# Patient Record
Sex: Female | Born: 1965 | Race: White | Hispanic: No | Marital: Single | Smoking: Never smoker
Health system: Southern US, Community
[De-identification: ages and names within clinical notes are randomized; demographics above are authoritative.]

## PROBLEM LIST (undated history)

## (undated) DIAGNOSIS — K56609 Unspecified intestinal obstruction, unspecified as to partial versus complete obstruction: Secondary | ICD-10-CM

## (undated) DIAGNOSIS — K624 Stenosis of anus and rectum: Secondary | ICD-10-CM

## (undated) DIAGNOSIS — F419 Anxiety disorder, unspecified: Secondary | ICD-10-CM

## (undated) DIAGNOSIS — D4989 Neoplasm of unspecified behavior of other specified sites: Secondary | ICD-10-CM

## (undated) DIAGNOSIS — M797 Fibromyalgia: Secondary | ICD-10-CM

## (undated) DIAGNOSIS — E162 Hypoglycemia, unspecified: Secondary | ICD-10-CM

## (undated) DIAGNOSIS — K219 Gastro-esophageal reflux disease without esophagitis: Secondary | ICD-10-CM

## (undated) DIAGNOSIS — D649 Anemia, unspecified: Secondary | ICD-10-CM

## (undated) HISTORY — DX: Hypoglycemia, unspecified: E16.2

## (undated) HISTORY — DX: Anemia, unspecified: D64.9

## (undated) HISTORY — DX: Fibromyalgia: M79.7

## (undated) HISTORY — DX: Stenosis of anus and rectum: K62.4

## (undated) HISTORY — DX: Unspecified intestinal obstruction, unspecified as to partial versus complete obstruction: K56.609

## (undated) HISTORY — PX: KNEE SURGERY: SHX244

## (undated) HISTORY — DX: Gastro-esophageal reflux disease without esophagitis: K21.9

## (undated) HISTORY — DX: Anxiety disorder, unspecified: F41.9

---

## 1993-12-02 HISTORY — PX: TOTAL COLECTOMY: SHX852

## 1993-12-02 HISTORY — PX: PROCTECTOMY: SHX315

## 2001-10-27 ENCOUNTER — Other Ambulatory Visit: Admission: RE | Admit: 2001-10-27 | Discharge: 2001-10-27 | Payer: Self-pay | Admitting: *Deleted

## 2002-11-02 ENCOUNTER — Other Ambulatory Visit: Admission: RE | Admit: 2002-11-02 | Discharge: 2002-11-02 | Payer: Self-pay | Admitting: Obstetrics and Gynecology

## 2002-11-28 ENCOUNTER — Encounter: Payer: Self-pay | Admitting: Internal Medicine

## 2003-02-12 ENCOUNTER — Encounter: Payer: Self-pay | Admitting: Internal Medicine

## 2003-02-12 ENCOUNTER — Ambulatory Visit (HOSPITAL_COMMUNITY): Admission: RE | Admit: 2003-02-12 | Discharge: 2003-02-12 | Payer: Self-pay | Admitting: Internal Medicine

## 2003-06-09 ENCOUNTER — Ambulatory Visit (HOSPITAL_COMMUNITY): Admission: RE | Admit: 2003-06-09 | Discharge: 2003-06-09 | Payer: Self-pay | Admitting: Neurosurgery

## 2003-12-04 ENCOUNTER — Other Ambulatory Visit: Admission: RE | Admit: 2003-12-04 | Discharge: 2003-12-04 | Payer: Self-pay | Admitting: Obstetrics and Gynecology

## 2004-04-09 ENCOUNTER — Ambulatory Visit: Payer: Self-pay | Admitting: Internal Medicine

## 2004-04-11 ENCOUNTER — Ambulatory Visit: Payer: Self-pay | Admitting: Internal Medicine

## 2004-10-23 ENCOUNTER — Ambulatory Visit: Payer: Self-pay | Admitting: Internal Medicine

## 2004-10-23 ENCOUNTER — Encounter (INDEPENDENT_AMBULATORY_CARE_PROVIDER_SITE_OTHER): Payer: Self-pay | Admitting: Specialist

## 2004-10-23 ENCOUNTER — Ambulatory Visit (HOSPITAL_COMMUNITY): Admission: RE | Admit: 2004-10-23 | Discharge: 2004-10-23 | Payer: Self-pay | Admitting: Internal Medicine

## 2005-03-12 ENCOUNTER — Other Ambulatory Visit: Admission: RE | Admit: 2005-03-12 | Discharge: 2005-03-12 | Payer: Self-pay | Admitting: Obstetrics and Gynecology

## 2005-07-22 ENCOUNTER — Ambulatory Visit: Payer: Self-pay | Admitting: Cardiology

## 2005-08-12 ENCOUNTER — Ambulatory Visit: Payer: Self-pay

## 2005-08-12 ENCOUNTER — Encounter: Payer: Self-pay | Admitting: Cardiology

## 2005-12-19 ENCOUNTER — Emergency Department (HOSPITAL_COMMUNITY): Admission: EM | Admit: 2005-12-19 | Discharge: 2005-12-19 | Payer: Self-pay | Admitting: Emergency Medicine

## 2005-12-19 ENCOUNTER — Emergency Department (HOSPITAL_COMMUNITY): Admission: EM | Admit: 2005-12-19 | Discharge: 2005-12-20 | Payer: Self-pay | Admitting: *Deleted

## 2005-12-19 ENCOUNTER — Encounter (INDEPENDENT_AMBULATORY_CARE_PROVIDER_SITE_OTHER): Payer: Self-pay | Admitting: *Deleted

## 2007-03-25 ENCOUNTER — Emergency Department (HOSPITAL_COMMUNITY): Admission: EM | Admit: 2007-03-25 | Discharge: 2007-03-25 | Payer: Self-pay | Admitting: Emergency Medicine

## 2007-03-27 ENCOUNTER — Emergency Department (HOSPITAL_COMMUNITY): Admission: EM | Admit: 2007-03-27 | Discharge: 2007-03-27 | Payer: Self-pay | Admitting: Emergency Medicine

## 2007-03-29 ENCOUNTER — Ambulatory Visit: Payer: Self-pay | Admitting: Internal Medicine

## 2007-04-04 ENCOUNTER — Ambulatory Visit (HOSPITAL_COMMUNITY): Admission: RE | Admit: 2007-04-04 | Discharge: 2007-04-04 | Payer: Self-pay | Admitting: Internal Medicine

## 2007-07-19 ENCOUNTER — Ambulatory Visit: Payer: Self-pay | Admitting: Internal Medicine

## 2009-04-18 ENCOUNTER — Telehealth: Payer: Self-pay | Admitting: Internal Medicine

## 2009-05-06 ENCOUNTER — Ambulatory Visit: Payer: Self-pay | Admitting: Internal Medicine

## 2009-05-06 DIAGNOSIS — K219 Gastro-esophageal reflux disease without esophagitis: Secondary | ICD-10-CM

## 2009-05-06 DIAGNOSIS — R1310 Dysphagia, unspecified: Secondary | ICD-10-CM | POA: Insufficient documentation

## 2009-07-16 ENCOUNTER — Ambulatory Visit: Payer: Self-pay | Admitting: Internal Medicine

## 2009-07-17 ENCOUNTER — Ambulatory Visit: Payer: Self-pay | Admitting: Internal Medicine

## 2009-07-17 ENCOUNTER — Telehealth (INDEPENDENT_AMBULATORY_CARE_PROVIDER_SITE_OTHER): Payer: Self-pay | Admitting: *Deleted

## 2009-09-23 ENCOUNTER — Emergency Department (HOSPITAL_COMMUNITY): Admission: EM | Admit: 2009-09-23 | Discharge: 2009-09-23 | Payer: Self-pay | Admitting: Emergency Medicine

## 2010-04-01 NOTE — Progress Notes (Signed)
Summary: Sooner appt.  Phone Note Call from Patient Call back at Home Phone [phone]   Caller: Patient Call For: Dr. Marina Goodell Reason for Call: Talk to Nurse Summary of Call: Pt is having problems swallowing and has an appt. on 05-06-09 and wants to know if she can be worked in sooner Initial call taken by: Karna Christmas,  April 18, 2009 12:13 PM  Follow-up for Phone Call        Message left for pt. to call the scheduler's as she can be seen tomorrow by N.P. Follow-up by: Teryl Lucy RN,  April 18, 2009 12:25 PM

## 2010-04-01 NOTE — Miscellaneous (Signed)
Summary: Orders Update  Clinical Lists Changes  Orders: Added new Test order of TLB-H Pylori Screen Gastric Biopsy (83013-CLOTEST) - Signed 

## 2010-04-01 NOTE — Procedures (Signed)
Summary: Flex Sigmoidoscopy   Flexible Sigmoidoscopy  Procedure date:  10/23/2004  Findings:      Anal Stricture Ulceration Pouch  Comments:      Location: [facility].   Flexible Sigmoidoscopy  Procedure date:  10/23/2004  Findings:      Anal Stricture Ulceration Pouch  Comments:      Location: [facility].  Patient Name: Gwendolyn Howard, Gwendolyn Howard MRN: [id number] Procedure Procedures: Flexible Proctosigmoidoscopy CPT: [phone].    with dilation of stricture.CPT: Q632156.  Personnel: Endoscopist: Wilhemina Bonito. Marina Goodell, MD.  Exam Location: Exam performed in Endoscopy Suite.  Patient Consent: Procedure, Alternatives, Risks and Benefits discussed, consent obtained,  Indications  Therapeutic Intervention Reason for exam: Dilation of Stricture.  Symptoms: Abdominal pain / bloating.  History  Current Medications: Patient is not currently taking Coumadin.  Pre-Exam Physical: Performed Oct 23, 2004. Entire physical exam was normal. Rectal exam WNL. Abnormal PE findings include: patent anastomosis on rectal.  Exam Exam: Extent visualized: Terminal Ileum. Extent of exam: 30 cm. Patient position: on left side. Colon retroflexion not performed. Images taken. ASA Classification: II. Tolerance: excellent.  Monitoring: Pulse and BP monitoring, Oximetry used. Supplemental O2 given.  Colon Prep Used ducolax for colon prep. Prep: excellent.  Fluoroscopy: Fluoroscopy was not used.  Sedation Meds: No sedation was given.  Findings - PRIOR SURGERY: Comments: S/P TOTAL ABDOMINAL COLECTOMY W/ ILEOANAL PULL THROUGH.  - OTHER FINDING:  MILD FOCAL SUPERFICIAL ULCERATION IN POUCH found in Rectum. Biopsy/Other Finding taken.  STRICTURE / STENOSIS: Rectum.  Constriction: partial. Etiology: ANASTOMOTIC. Lumen diameter is 13 mm. ICD9: Anal Stricture: 569.2.  - Dilation: Rectum. Balloon/Wilson-Cook dilator used, Diameter: 16 mm, Minimal Resistance, Minimal Heme present on extraction.  1  total dilators used. Patient tolerance excellent.   Assessment Abnormal examination, see findings above.  Diagnoses: 569.2: Anal Stricture.   Comments: ULCERATION / POUCH Events  Unplanned Intervention: No intervention was required.  Unplanned Events: There were no complications. Plans Disposition: After procedure patient sent to recovery. After recovery patient sent home.  Scheduling: Follow-Up prn. Call office for biopsy results, to Wilhemina Bonito. Marina Goodell, MD, NEXT WEEK,    This report was created from the original endoscopy report, which was reviewed and signed by the above listed endoscopist.   cc:  The Patient

## 2010-04-01 NOTE — Procedures (Signed)
Summary: Upper Endoscopy  Patient: [name] Note: All result statuses are Final unless otherwise noted.  Tests: (1) Upper Endoscopy (EGD)   EGD Upper Endoscopy       DONE     [facility]     [address]     [place], [place]  [address]           ENDOSCOPY PROCEDURE REPORT           PATIENT:  Gwendolyn Howard, Gwendolyn Howard  MR#:  [id number]     BIRTHDATE:  02/08/1966, 43 yrs. old  GENDER:  female           ENDOSCOPIST:  Wilhemina Bonito. Eda Keys, MD     Referred by:  Office           PROCEDURE DATE:  07/16/2009     PROCEDURE:  EGD with biopsy     ASA CLASS:  Class II     INDICATIONS:  GERD, dysphagia           MEDICATIONS:   Fentanyl 100 mcg IV, Versed 10 mg IV     TOPICAL ANESTHETIC:  Exactacain Spray           DESCRIPTION OF PROCEDURE:   After the risks benefits and     alternatives of the procedure were thoroughly explained, informed     consent was obtained.  The LB [id number]-H180 [id number] endoscope was     introduced through the mouth and advanced to the second portion of     the duodenum, without limitations.  The instrument was slowly     withdrawn as the mucosa was fully examined.     <<PROCEDUREIMAGES>>           The upper, middle, and distal third of the esophagus were     carefully inspected and no abnormalities were noted. The z-line     was well seen at the GEJ. The endoscope was pushed into the fundus     which was normal including a retroflexed view. The antrum,gastric     body, and second part of the duodenum were unremarkable.  Mild     nonerosiveDuodenitis was found in the bulb of the duodenum. CLO BX     taken.   Retroflexed views revealed no abnormalities.    The scope     was then withdrawn from the patient and the procedure completed.           COMPLICATIONS:  None           ENDOSCOPIC IMPRESSION:     1) Essentially Normal EGD     2) Mild Duodenitis in the bulb of duodenum     3) Gerd           RECOMMENDATIONS:     1) Continue PPI daily (on Zegerid)     2)  Reflux precautions     3) Treat H.Pylori if CLO test +     4) Follow up prn           ______________________________     Wilhemina Bonito. Eda Keys, MD           CC:  Belva Agee, NP, The Patient           n.     Rosalie DoctorWilhemina Bonito. Eda Keys at 07/16/2009 02:56 PM           Verneita Griffes, [id number]  Note: An exclamation mark (!) indicates a result that was not dispersed into  the flowsheet. Document Creation Date: 07/18/2009 5:07 PM _______________________________________________________________________  (1) Order result status: Final Collection or observation date-time: 07/16/2009 14:48 Requested date-time:  Receipt date-time:  Reported date-time:  Referring Physician:   Ordering Physician: Fransico Setters [phone]) Specimen Source:  Source: [name] Order Number: [id number] Lab site:

## 2010-04-01 NOTE — Progress Notes (Signed)
Summary: [facility]  [facility]   Imported By: Lanelle Bal 06/19/2009 09:54:49  _____________________________________________________________________  External Attachment:    Type:   Image     Comment:   External Document

## 2010-04-01 NOTE — Procedures (Signed)
Summary: Flex Sigmoidoscopy   Flexible Sigmoidoscopy  Procedure date:  02/12/2003  Findings:      Anal Stricture  Comments:      Location: [facility].  Patient Name: Cornesha, Radziewicz MRN: [id number] Procedure Procedures: Flexible Proctosigmoidoscopy CPT: [phone].    with dilation of stricture.CPT: Q632156.  Personnel: Endoscopist: Wilhemina Bonito. Marina Goodell, MD.  Exam Location: Exam performed in Endoscopy Suite.  Patient Consent: Procedure, Alternatives, Risks and Benefits discussed, consent obtained,  Indications  Therapeutic Intervention Reason for exam: Dilation of Stricture.  History  Current Medications: Patient is not currently taking Coumadin.  Comments: S/P TOTAL ABDOMINAL COLECTOMY AND PROCTECTOMY WITH ILEAL POUCH AND ILEOANAL PULLTHROUGH Pre-Exam Physical: Performed Feb 12, 2003. Entire physical exam was normal. Rectal exam abnormal. Abnormal PE findings include: palpable stricture.  Exam Exam: Extent visualized: Ileum. Extent of exam: 25 cm. Patient position: on left side. Images taken. ASA Classification: II. Tolerance: excellent.  Monitoring: Pulse and BP monitoring, Oximetry used. Supplemental O2 given.  Colon Prep Used oral laxative for colon prep. Prep: excellent.  Fluoroscopy: Fluoroscopy was not used.  Sedation Meds: No sedation was given.  Findings STRICTURE / STENOSIS: Stricture in Rectum.  Constriction: partial. Etiology: ANASTOMOTIC. Lumen diameter is 9 mm. Comments: HEALTHY POUCH AND DISTAL ILEAL MUCOSA.  - Dilation: Rectum. Balloon/Microvasive dilator used, Diameter: 16 mm, Minimal Resistance, Minimal Heme present on extraction. 1  total dilators used. Patient tolerance excellent. Outcome: successful.   Assessment Abnormal examination, see findings above.  Events  Unplanned Intervention: No intervention was required.  Unplanned Events: There were no complications. Plans Disposition: After procedure patient sent to recovery.    Scheduling: Follow-Up prn.   This report was created from the original endoscopy report, which was reviewed and signed by the above listed endoscopist.   cc:  The Patient

## 2010-04-01 NOTE — Progress Notes (Signed)
Summary: [facility]  [facility]   Imported By: Lanelle Bal 06/19/2009 09:56:48  _____________________________________________________________________  External Attachment:    Type:   Image     Comment:   External Document

## 2010-04-01 NOTE — Assessment & Plan Note (Signed)
Summary: problems swallowing--ch.   History of Present Illness Visit Type: Follow-up Visit Primary GI MD: Yancey Flemings MD Primary Provider: Belva Agee, NP Chief Complaint: Patient having some dysphagia since she stopped taking her Nexium daily as directed. Her symptoms started a couple of weeks ago. Since restarting her Nexium daily her dysphagia has been better. She complains of some chest pain that has since resolved.  History of Present Illness:   45 year old female with a history fibromyalgia, anxiety disorder, asthma, of ulcerative colitis for which she eventually underwent total abdominal colectomy and proctectomy with ileal anal pouch and diverting ileostomy with subsequent closure of her loop ileostomy in 1995. She has had intermittent problems with low anastomotic stricturing which has required self dilation as well as endoscopic balloon dilation. She was last seen in the office in January of 2009. She is known to have reflux disease. She was to have had upper endoscopy but did not follow through. Her chief complaint today is worsening reflux and dysphagia. Reports that she had been taking Nexium sporadically with significant reflux. Several weeks ago she describes intermittent solid and pill dysphagia. Since making her office appointment in a few weeks ago, she has taken the Nexium regularly. She has had improvement in classic reflux symptoms. Dysphasia has also improved somewhat though not entirely. She has not had prior upper endoscopy. Currently without notable lower GI complaints.   GI Review of Systems    Reports acid reflux, chest pain, dysphagia with liquids, dysphagia with solids, and  heartburn.      Denies abdominal pain, belching, bloating, loss of appetite, nausea, vomiting, vomiting blood, weight loss, and  weight gain.        Denies anal fissure, black tarry stools, change in bowel habit, constipation, diarrhea, diverticulosis, fecal incontinence, heme positive stool,  hemorrhoids, irritable bowel syndrome, jaundice, light color stool, liver problems, rectal bleeding, and  rectal pain. Preventive Screening-Counseling & Management  Alcohol-Tobacco     Smoking Status: never      Drug Use:  no.      Current Medications (verified): 1)  Flexeril 10 Mg Tabs (Cyclobenzaprine Hcl) .... Take 1 By Mouth As Needed 2)  Nexium 40 Mg Cpdr (Esomeprazole Magnesium) .Marland Kitchen.. 1 Capsule By Mouth Once Daily 3)  Advair Diskus 250-50 Mcg/dose Aepb (Fluticasone-Salmeterol) .... Use Two Times A Day 4)  Ibuprofen 200 Mg Tabs (Ibuprofen) .... Take As Needed 5)  Benicar 20 Mg Tabs (Olmesartan Medoxomil) .... Take One By Mouth Once Daily 6)  Aleve 220 Mg Tabs (Naproxen Sodium) .... Take As Needed 7)  Cyanocobalamin 1000 Mcg/ml Soln (Cyanocobalamin) .... One Ml Injection Once A Month  Allergies (verified): 1)  ! Erythromycin 2)  ! Asacol  Past History:  Past Medical History: Anal Stricture GERD Ulcerative Colitis Small Bowel Obstruction Fibromyalgia Anxiety Disorder Hypoglycemia Anemia  Past Surgical History: Total Colectomy/Proctectomy with ileal anal pouch Lt. 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  Alcohol Use - yes once every 3-4 months Daily Caffeine Use 2 soda per day Illicit Drug Use - no Smoking Status:  never Drug Use:  no  Review of Systems       The patient complains of allergy/sinus, anemia, arthritis/joint pain, back pain, fatigue, menstrual pain, muscle pains/cramps, and swelling of feet/legs.  The patient denies anxiety-new, blood in urine, breast changes/lumps, change in vision, confusion, cough, coughing up blood, depression-new, fainting, fever,  headaches-new, hearing problems, heart murmur, heart rhythm changes, itching, night sweats, nosebleeds, pregnancy symptoms, shortness of breath, skin rash, sleeping problems, sore throat, swollen lymph glands, thirst - excessive , urination - excessive , urination changes/pain, urine leakage, vision changes, and voice change.    Vital Signs:  Patient profile:   45 year old female Height:      65.5 inches Weight:      219.0 pounds BMI:     36.02 Pulse rate:   80 / minute Pulse rhythm:   regular BP sitting:   118 / 68  (left arm) Cuff size:   regular  Vitals Entered By: Harlow Mares CMA Duncan Dull) (May 06, 2009 9:02 AM)  Physical Exam  General:  Well developed, well nourished, no acute distress. Head:  Normocephalic and atraumatic. Eyes:  conjunctivitis right eye Ears:  Normal auditory acuity. Nose:  No deformity, discharge,  or lesions. Mouth:  No deformity or lesions, dentition normal. Neck:  Supple; no masses or thyromegaly. Lungs:  Clear throughout to auscultation. Heart:  Regular rate and rhythm; no murmurs, rubs,  or bruits. Abdomen:  Soft, nontender and nondistended. No masses, hepatosplenomegaly or hernias noted. Normal bowel sounds.surgical incisions well-healed Msk:  Symmetrical with no gross deformities. Normal posture. Pulses:  Normal pulses noted. Extremities:  No clubbing, cyanosis, edema or deformities noted. Neurologic:  Alert and  oriented x4;  grossly normal neurologically. Skin:  Intact without significant lesions or rashes. Cervical Nodes:  No significant cervical adenopathy.no supraclavicular adenopathy Psych:  Alert and cooperative. Normal mood and affect.   Impression & Recommendations:  Problem # 1:  ESOPHAGEAL REFLUX (ICD-530.81) chronic GERD. Significant breakthrough symptoms off Nexium.  Plan: #1. Resume Nexium 40 mg daily. She does take the medication 30 minutes before the first meal #2. Reflux precautions  Problem # 2:  DYSPHAGIA UNSPECIFIED  (ICD-787.20) intermittent solid food and pill dysphagia. Improved, though incompletely, with Nexium. Rule out peptic stricture.  Plan: #1. Upper endoscopy with possible esophageal dilation. The nature of the procedure as well as the risks, benefits, and alternatives have been reviewed. She understood and agreed to proceed.  Problem # 3:  Prior history of ulcerative colitis status post total abdominal colectomy and proctectomy with ileoanal anastomosis. History of anastomotic stricture. Currently stable without symptoms  Other Orders: EGD (EGD)  Patient Instructions: 1)  Upper Endoscopy brochure given.  2)  You have been scheduled for a EGD on 06/17/09. 3)  The medication list was reviewed and reconciled.  All changed / newly prescribed medications were explained.  A complete medication list was provided to the patient / caregiver.

## 2010-04-01 NOTE — Progress Notes (Signed)
Summary: [facility]  [facility]   Imported By: Lanelle Bal 06/19/2009 09:55:44  _____________________________________________________________________  External Attachment:    Type:   Image     Comment:   External Document

## 2010-04-01 NOTE — Letter (Signed)
Summary: EGD Instructions  [facility]  [address] [place], [place] [address]   Phone: [phone]  Fax: [phone]       Gwendolyn Howard    Nov 16, 1965    MRN: [id number]       Procedure Day /Date:06/17/09     Arrival Time: 12:30 pm     Procedure Time:1:30pm    Location of Procedure:                    X [facility] (4th Floor)   PREPARATION FOR ENDOSCOPY   On 06/17/09 THE DAY OF THE PROCEDURE:  1.   No solid foods, milk or milk products are allowed after midnight the night before your procedure.  2.   Do not drink anything colored red or purple.  Avoid juices with pulp.  No orange juice.  3.  You may drink clear liquids until 930 am , which is 2 hours before your procedure.                                                                                                CLEAR LIQUIDS INCLUDE: Water Jello Ice Popsicles Tea (sugar ok, no milk/cream) Powdered fruit flavored drinks Coffee (sugar ok, no milk/cream) Gatorade Juice: apple, white grape, white cranberry  Lemonade Clear bullion, consomm, broth Carbonated beverages (any kind) Strained chicken noodle soup Hard Candy   MEDICATION INSTRUCTIONS  Unless otherwise instructed, you should take regular prescription medications with a small sip of water as early as possible the morning of your procedure.            OTHER INSTRUCTIONS  You will need a responsible adult at least 46 years of age to accompany you and drive you home.   This person must remain in the waiting room during your procedure.  Wear loose fitting clothing that is easily removed.  Leave jewelry and other valuables at home.  However, you may wish to bring a book to read or an iPod/MP3 player to listen to music as you wait for your procedure to start.  Remove all body piercing jewelry and leave at home.  Total time from sign-in until discharge is approximately 2-3 hours.  You should go home directly after your  procedure and rest.  You can resume normal activities the day after your procedure.  The day of your procedure you should not:   Drive   Make legal decisions   Operate machinery   Drink alcohol   Return to work  You will receive specific instructions about eating, activities and medications before you leave.    The above instructions have been reviewed and explained to me by   _______________________    I fully understand and can verbalize these instructions _____________________________ Date _________

## 2010-04-01 NOTE — Progress Notes (Signed)
  Phone Note Call from Patient   Summary of Call: Pt. called and requested written rx. for Zegerid  so she can use her "[employer]" as she discussed with Dr.Perry at procedure yesterday that it works better than Nexium. RX. mailed to pt. Initial call taken by: Teryl Lucy RN,  Jul 17, 2009 4:04 PM    New/Updated Medications: ZEGERID OTC 20-1100 MG CAPS (OMEPRAZOLE-SODIUM BICARBONATE) Take 1 p.o.q.am one half hr.prior to breakfast Prescriptions: ZEGERID OTC 20-1100 MG CAPS (OMEPRAZOLE-SODIUM BICARBONATE) Take 1 p.o.q.am one half hr.prior to breakfast  #30 x 11   Entered by:   Teryl Lucy RN   Authorized by:   Hilarie Fredrickson MD   Signed by:   Teryl Lucy RN on 07/17/2009   Method used:   Print then Give to Patient   RxID:   [id number]

## 2010-07-15 NOTE — Assessment & Plan Note (Signed)
[facility]                         GASTROENTEROLOGY OFFICE NOTE   NAME:Antonio, Gwendolyn Howard                      MRN:          [id number]  DATE:03/29/2007                            DOB:          1965-12-20    HISTORY:  Patient presents today for followup.  She has not been seen in  this office for three years.  She is 45 years old and has a history of  universal ulcerative colitis for which she eventually underwent total  abdominal colectomy and proctectomy with ileoanal pouch and diverting  ileostomy with subsequent closure of her loop ileostomy in 1995.  She  has also had intermittent problems with low anastomotic stricturing for  which she has self dilated as well as required endoscopic balloon  dilatation.  She states that she has had some intermittent problems with  nausea over the past eight to nine months.  No vomiting.  She also has  indigestion and heartburn despite once daily Nexium.  No dysphagia.  This past weekend she developed nausea, vomiting, diarrhea and  dehydration,  She was seen in the emergency room on a couple of  occasions and required some IV fluids.  She was felt to have a viral  gastroenteritis.  Her labs were unremarkable.  Imodium helped her  diarrhea and Zofran helped her nausea.  Vomiting has subsided.  She is  concerned today regarding her nausea.  Also she feels she may have  problems with her gall bladder because there is a family history.  She  had a negative abdominal ultrasound three years ago.  She states she has  had some perianal pain and wonders if she may not need her pouch  evaluated.  Her last flexible sigmoidoscopy was in August of 2006.  She  was found to have distal stricture as well as a mild focal superficial  ulceration of the pouch.   PAST MEDICAL HISTORY:  As above.   ALLERGIES:  Erythromycin and Acecol.   CURRENT MEDICATIONS:  Tandem, Flexeril, Nexium, B12 injections and  Cymbalta.   FAMILY  HISTORY:  Noncontributory.   SOCIAL HISTORY:  Single without children.  Works as a Engineer, civil (consulting) at [employer].   PHYSICAL EXAMINATION:  Well appearing female in no acute distress.  Blood pressure is 120/86.  Heart rate is 80.  Weight is 208.2 pounds.  HEENT:  Sclerae are anicteric.  LUNGS:  Clear.  HEART: Regular.  ABDOMEN:  Soft without tenderness, mass or hernia.  Previous surgical  incisions are well healed.  RECTAL:  Exam was deferred.   IMPRESSION:  1. Recent problems with nausea, vomiting and diarrhea almost certainly      due to viral gastroenteritis.  The patient has improved.  2. Gastroesophageal reflux disease.  Breakthrough symptoms on once      daily Nexium.  3. Intermittent problems with nausea likely due to breakthrough      reflux.  4. History of ulcerative colitis status post total abdominal colectomy      with ileoanal anastomosis.  Postoperative problems with stricturing      requiring dilation, now with some vague  rectal discomfort.   RECOMMENDATIONS:  1. Continue Nexium.  2. Reflux precautions with attention to weight loss.  3. Upper endoscopy to evaluate persistent nausea and chronic reflux      symptoms.  4. Repeat abdominal ultrasound to rule out gallstones as a cause for      chronic nausea and occassional pain.  5. Flexible sigmoidoscopy at the time of endoscopy to assess her pouch      and stricture.  She may require dilatation if significant      stricturing found.  The nature of the procedure as well as the      risks, benefits and alternatives have been reviewed.  She      understood and agreed to proceed.  6. Continue to use Imodium and Zofran p.r.n.     Wilhemina Bonito. Marina Goodell, MD  Electronically Signed    JNP/MedQ  DD: 03/29/2007  DT: 03/29/2007  Job #: [id number]   cc:   Magnus Sinning. Rice, M.D.

## 2010-11-02 ENCOUNTER — Emergency Department (HOSPITAL_COMMUNITY): Payer: PRIVATE HEALTH INSURANCE

## 2010-11-02 ENCOUNTER — Emergency Department (HOSPITAL_COMMUNITY)
Admission: EM | Admit: 2010-11-02 | Discharge: 2010-11-02 | Disposition: A | Discharge status: Home / Self Care | Payer: PRIVATE HEALTH INSURANCE | Attending: Emergency Medicine | Admitting: Emergency Medicine

## 2010-11-02 DIAGNOSIS — M25519 Pain in unspecified shoulder: Secondary | ICD-10-CM | POA: Insufficient documentation

## 2010-11-20 LAB — CLOSTRIDIUM DIFFICILE EIA

## 2010-11-20 LAB — URINALYSIS, ROUTINE W REFLEX MICROSCOPIC
Glucose, UA: NEGATIVE
Hgb urine dipstick: NEGATIVE
Leukocytes, UA: NEGATIVE
Protein, ur: NEGATIVE
Specific Gravity, Urine: >1.03 — ABNORMAL HIGH
pH: 5.5

## 2010-11-20 LAB — CBC
MCHC: 33.8
MCV: 90.3
Platelets: 389
WBC: 8.4

## 2010-11-20 LAB — OVA AND PARASITE EXAMINATION: Ova and parasites: NONE SEEN

## 2010-11-20 LAB — POCT I-STAT CREATININE
Creatinine, Ser: 0.8
Operator id: 284251

## 2010-11-20 LAB — DIFFERENTIAL
Basophils Absolute: 0
Basophils Relative: 0
Eosinophils Absolute: 0.2
Neutrophils Relative %: 69

## 2010-11-20 LAB — I-STAT 8, (EC8 V) (CONVERTED LAB)
Acid-base deficit: 2
Bicarbonate: 24.6 — ABNORMAL HIGH
TCO2: 26
pCO2, Ven: 47.1
pH, Ven: 7.326 — ABNORMAL HIGH

## 2010-11-20 LAB — STOOL CULTURE

## 2010-11-20 LAB — BASIC METABOLIC PANEL
CO2: 25
Calcium: 8.9
Creatinine, Ser: 0.85
GFR calc Af Amer: >60
GFR calc non Af Amer: >60

## 2010-11-20 LAB — URINE MICROSCOPIC-ADD ON

## 2011-04-13 ENCOUNTER — Ambulatory Visit (INDEPENDENT_AMBULATORY_CARE_PROVIDER_SITE_OTHER): Payer: PRIVATE HEALTH INSURANCE | Admitting: Internal Medicine

## 2011-04-13 ENCOUNTER — Encounter: Payer: Self-pay | Admitting: Internal Medicine

## 2011-04-13 VITALS — BP 104/70 | HR 64 | Ht 65.5 in | Wt 206.6 lb

## 2011-04-13 DIAGNOSIS — K219 Gastro-esophageal reflux disease without esophagitis: Secondary | ICD-10-CM

## 2011-04-13 MED ORDER — DEXLANSOPRAZOLE 60 MG PO CPDR: 60.0000 mg | DELAYED_RELEASE_CAPSULE | Freq: Every day | ORAL | Status: DC

## 2011-04-13 NOTE — Patient Instructions (Signed)
We have sent the following medications to your pharmacy for you to pick up at your convenience:  Dexilant  You have also been given some samples of dexilant and some reflux information

## 2011-04-13 NOTE — Progress Notes (Signed)
HISTORY OF PRESENT ILLNESS:  Gwendolyn Howard is a 46 y.o. 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 She has intermittent problems with low anastomotic stricturing for which she self dilates. She has also had balloon dilatation. She is also followed in this office for GERD. She was last seen in March of 2011 with complaints of reflux and dysphagia. She did dictation. Improvement in symptoms on appropriate medication. She did undergo complete upper endoscopy in May of 2011. This was essentially normal except for mild duodenitis. She presents now with a 3 month history of cough and wheezing. She feels the symptoms are worse after meals. Also globus sensation. She changed from Zegerid to pantoprazole without improvement. Recently stopped pantoprazole and is now taking Zantac. Some heartburn. She also has allergies which are worse recently. She also complains of raspy voice. No significant weight loss.  REVIEW OF SYSTEMS:  All non-GI ROS negative except for allergy, cough, voice change, ankle edema  Past Medical History  Diagnosis Date  . Anal stricture   . GERD (gastroesophageal reflux disease)   . Ulcerative colitis   . Small bowel obstruction   . Fibromyalgia   . Anxiety   . Hypoglycemia   . Anemia     Past Surgical History  Procedure Date  . Total colectomy 12/02/1993  . Proctectomy 12/02/1993  . Knee surgery     lt    Social History NAYA ILAGAN  reports that she has never smoked. She has never used smokeless tobacco. She reports that she drinks alcohol. She reports that she does not use illicit drugs.  family history includes Diabetes in her maternal grandmother, maternal uncle, and paternal grandmother; Diverticulitis in her paternal grandfather; Heart disease in her father; Hypothyroidism in  her mother; Irritable bowel syndrome in her sister; and Leukemia in an unspecified family member.  There is no history of Colon cancer.  Allergies  Allergen Reactions  . Erythromycin   . Mesalamine        PHYSICAL EXAMINATION:  Vital signs: BP 104/70  Pulse 64  Ht 5' 5.5" (1.664 m)  Wt 206 lb 9.6 oz (93.713 kg)  BMI 33.86 kg/m2  LMP 04/11/2011 General: Well-developed, well-nourished, no acute distress HEENT: Sclerae are anicteric, conjunctiva pink. Oral mucosa intact. No adenopathy Lungs: Clear Heart: Regular Abdomen: soft, nontender, nondistended, no obvious ascites, no peritoneal signs, normal bowel sounds. No organomegaly. Prior surgical incision well healed. Extremities: No edema Psychiatric: alert and oriented x3. Cooperative    ASSESSMENT:  #1. GERD. Not clear that the entirety of her symptom complexes related to GERD. She does have allergies and asthma which may be significant contributors to wheezing and cough. #2. Normal endoscopy May 2011 #3. History of ulcerative colitis status post total abdominal colectomy with proctectomy   PLAN:  #1. Stop Zantac and initiate Dexilant 60 mg daily. Multiple samples have been provided as well as a prescription submitted electronically #2. Reflux precautions with attention to weight loss #3. See her primary provider regarding asthma and allergies #4. GI followup as needed

## 2011-05-06 ENCOUNTER — Other Ambulatory Visit: Payer: Self-pay

## 2011-05-06 ENCOUNTER — Telehealth: Payer: Self-pay | Admitting: Internal Medicine

## 2011-05-06 MED ORDER — DEXLANSOPRAZOLE 60 MG PO CPDR: 60.0000 mg | DELAYED_RELEASE_CAPSULE | Freq: Every day | ORAL | Status: DC

## 2011-06-02 NOTE — Telephone Encounter (Signed)
Script resent on 05-06-11

## 2011-06-26 ENCOUNTER — Institutional Professional Consult (permissible substitution): Payer: PRIVATE HEALTH INSURANCE | Admitting: Cardiovascular Disease

## 2011-11-30 ENCOUNTER — Other Ambulatory Visit: Payer: Self-pay

## 2011-11-30 MED ORDER — DEXLANSOPRAZOLE 60 MG PO CPDR: 60.0000 mg | DELAYED_RELEASE_CAPSULE | Freq: Every day | ORAL | Status: DC

## 2011-11-30 NOTE — Telephone Encounter (Signed)
Refilled Dexilant 

## 2011-12-15 ENCOUNTER — Emergency Department (HOSPITAL_COMMUNITY)
Admission: EM | Admit: 2011-12-15 | Discharge: 2011-12-15 | Disposition: A | Discharge status: Home / Self Care | Payer: PRIVATE HEALTH INSURANCE | Attending: Emergency Medicine | Admitting: Emergency Medicine

## 2011-12-15 ENCOUNTER — Emergency Department (HOSPITAL_COMMUNITY): Payer: PRIVATE HEALTH INSURANCE

## 2011-12-15 ENCOUNTER — Encounter (HOSPITAL_COMMUNITY): Payer: Self-pay | Admitting: Emergency Medicine

## 2011-12-15 DIAGNOSIS — R197 Diarrhea, unspecified: Secondary | ICD-10-CM | POA: Insufficient documentation

## 2011-12-15 DIAGNOSIS — R112 Nausea with vomiting, unspecified: Secondary | ICD-10-CM

## 2011-12-15 DIAGNOSIS — Z79899 Other long term (current) drug therapy: Secondary | ICD-10-CM | POA: Insufficient documentation

## 2011-12-15 DIAGNOSIS — IMO0001 Reserved for inherently not codable concepts without codable children: Secondary | ICD-10-CM | POA: Insufficient documentation

## 2011-12-15 HISTORY — DX: Neoplasm of unspecified behavior of other specified sites: D49.89

## 2011-12-15 LAB — LIPASE, BLOOD: Lipase: 19 U/L (ref 11–59)

## 2011-12-15 LAB — CBC WITH DIFFERENTIAL/PLATELET
Basophils Absolute: 0 10*3/uL (ref 0.0–0.1)
Eosinophils Relative: 2 % (ref 0–5)
HCT: 45.4 % (ref 36.0–46.0)
Lymphocytes Relative: 4 % — ABNORMAL LOW (ref 12–46)
MCV: 89.4 fL (ref 78.0–100.0)
Monocytes Absolute: 0.8 10*3/uL (ref 0.1–1.0)
RDW: 13.3 % (ref 11.5–15.5)
WBC: 16.6 10*3/uL — ABNORMAL HIGH (ref 4.0–10.5)

## 2011-12-15 LAB — URINALYSIS, MICROSCOPIC ONLY
Glucose, UA: NEGATIVE mg/dL
Ketones, ur: NEGATIVE mg/dL
Leukocytes, UA: NEGATIVE
Nitrite: NEGATIVE
Protein, ur: NEGATIVE mg/dL

## 2011-12-15 LAB — COMPREHENSIVE METABOLIC PANEL
AST: 15 U/L (ref 0–37)
CO2: 26 mEq/L (ref 19–32)
Calcium: 9.6 mg/dL (ref 8.4–10.5)
Creatinine, Ser: 0.71 mg/dL (ref 0.50–1.10)
GFR calc Af Amer: >90 mL/min (ref >90)
GFR calc non Af Amer: >90 mL/min (ref >90)
Glucose, Bld: 116 mg/dL — ABNORMAL HIGH (ref 70–99)

## 2011-12-15 LAB — POCT PREGNANCY, URINE: Preg Test, Ur: NEGATIVE

## 2011-12-15 MED ORDER — ONDANSETRON 8 MG PO TBDP: 8.0000 mg | ORAL_TABLET | Freq: Three times a day (TID) | ORAL | Status: DC | PRN

## 2011-12-15 MED ORDER — ONDANSETRON HCL 4 MG/2ML IJ SOLN
4.0000 mg | Freq: Once | INTRAMUSCULAR | Status: AC
  Administered 2011-12-15: 4 mg via INTRAVENOUS
  Filled 2011-12-15: qty 2

## 2011-12-15 MED ORDER — ONDANSETRON 8 MG PO TBDP
8.0000 mg | ORAL_TABLET | Freq: Once | ORAL | Status: DC
  Filled 2011-12-15: qty 1

## 2011-12-15 MED ORDER — SODIUM CHLORIDE 0.9 % IV BOLUS (SEPSIS)
1000.0000 mL | Freq: Once | INTRAVENOUS | Status: AC
  Administered 2011-12-15: 1000 mL via INTRAVENOUS

## 2011-12-15 NOTE — ED Notes (Signed)
Dr. Rubin Payor given copy of abdominal xray, as results not crossing over into EPIC correctly.

## 2011-12-15 NOTE — ED Provider Notes (Signed)
History     CSN: [id number]  Arrival date & time 12/15/11  1011   First MD Initiated Contact with Patient 12/15/11 1041      Chief Complaint  Patient presents with  . Emesis    (Consider location/radiation/quality/duration/timing/severity/associated sxs/prior treatment) Patient is a 46 y.o. female presenting with vomiting. The history is provided by the patient.  Emesis  Associated symptoms include abdominal pain and diarrhea. Pertinent negatives include no headaches.   patient developed nausea vomiting. She has a J-pouch after previous colectomy for ulcerative colitis. She states she threw up 5x4 arriving. She states she's had a previous obstruction, but this feels different now. She states she's had 2 watery stools. She has also gone to the bathroom and upper lower abdominal pain feels better. She's states this does not feel like her obstruction anymore. No fevers. She thinks this may be a gastroenteritis.  Past Medical History  Diagnosis Date  . Anal stricture   . GERD (gastroesophageal reflux disease)   . Ulcerative colitis   . Small bowel obstruction   . Fibromyalgia   . Anxiety   . Hypoglycemia   . Anemia   . Tumor, foot     Past Surgical History  Procedure Date  . Total colectomy 12/02/1993  . Proctectomy 12/02/1993  . Knee surgery     lt    Family History  Problem Relation Age of Onset  . Leukemia      1/2 sister  . Heart disease Father   . Diabetes Maternal Grandmother   . Diabetes Paternal Grandmother   . Diabetes Maternal Uncle   . Hypothyroidism Mother   . Irritable bowel syndrome Sister   . Colon cancer Neg Hx   . Diverticulitis Paternal Grandfather     History  Substance Use Topics  . Smoking status: Never Smoker   . Smokeless tobacco: Never Used  . Alcohol Use: Yes     every 3 to 4 months    OB History    Grav Para Term Preterm Abortions TAB SAB Ect Mult Living                  Review of Systems  Constitutional: Negative for  activity change and appetite change.  HENT: Negative for neck stiffness.   Eyes: Negative for pain.  Respiratory: Negative for chest tightness and shortness of breath.   Cardiovascular: Negative for chest pain and leg swelling.  Gastrointestinal: Positive for nausea, vomiting, abdominal pain and diarrhea.  Genitourinary: Negative for flank pain.  Musculoskeletal: Negative for back pain.  Skin: Negative for rash.  Neurological: Negative for weakness, numbness and headaches.  Psychiatric/Behavioral: Negative for behavioral problems.    Allergies  Erythromycin and Mesalamine  Home Medications   Current Outpatient Rx  Name Route Sig Dispense Refill  . BECLOMETHASONE DIPROPIONATE 80 MCG/ACT IN AERS Inhalation Inhale 1 puff into the lungs 2 (two) times daily.    . CYCLOBENZAPRINE HCL 10 MG PO TABS Oral Take 10 mg by mouth 3 (three) times daily as needed.    . DEXLANSOPRAZOLE 60 MG PO CPDR Oral Take 1 capsule (60 mg total) by mouth daily. 90 capsule 3  . ALLEGRA PO Oral Take 180 mg by mouth daily.     Marland Kitchen LISINOPRIL-HYDROCHLOROTHIAZIDE 20-12.5 MG PO TABS Oral Take 1 tablet by mouth daily.    Marland Kitchen VITAMIN B-12 IJ Injection Inject 1,000 mcg as directed every 30 (thirty) days. Last injection 12-05-11    . NONFORMULARY OR COMPOUNDED ITEM  Immunotherapy 2  injections q week. [facility]    . ONDANSETRON 8 MG PO TBDP Oral Take 1 tablet (8 mg total) by mouth every 8 (eight) hours as needed for nausea. 20 tablet 0    BP 112/68  Pulse 80  Temp 98.2 F (36.8 C) (Oral)  Resp 16  SpO2 98%  Physical Exam  Nursing note and vitals reviewed. Constitutional: She is oriented to person, place, and time. She appears well-developed and well-nourished.  HENT:  Head: Normocephalic and atraumatic.  Eyes: EOM are normal. Pupils are equal, round, and reactive to light.  Neck: Normal range of motion. Neck supple.  Cardiovascular: Normal rate, regular rhythm and normal heart sounds.   No murmur  heard. Pulmonary/Chest: Effort normal and breath sounds normal. No respiratory distress. She has no wheezes. She has no rales.  Abdominal: Soft. Bowel sounds are normal. She exhibits no distension. There is no tenderness. There is no rebound and no guarding.       Scars from previous surgery. No mass. No hernias palpated. No Ostomy.  Musculoskeletal: Normal range of motion.  Neurological: She is alert and oriented to person, place, and time. No cranial nerve deficit.  Skin: Skin is warm and dry.  Psychiatric: She has a normal mood and affect. Her speech is normal.    ED Course  Procedures (including critical care time)  Labs Reviewed  URINALYSIS, MICROSCOPIC ONLY - Abnormal; Notable for the following:    APPearance TURBID (*)     Bacteria, UA FEW (*)     Squamous Epithelial / LPF FEW (*)     All other components within normal limits  CBC WITH DIFFERENTIAL - Abnormal; Notable for the following:    WBC 16.6 (*)     Hemoglobin 15.6 (*)     Neutrophils Relative 89 (*)     Neutro Abs 14.7 (*)     Lymphocytes Relative 4 (*)     All other components within normal limits  COMPREHENSIVE METABOLIC PANEL - Abnormal; Notable for the following:    Glucose, Bld 116 (*)     All other components within normal limits  LIPASE, BLOOD  POCT PREGNANCY, URINE   Dg Abd 2 Views  12/15/2011  *RADIOLOGY REPORT*  Clinical Data:  Abdominal pain  ABDOMEN - 2 VIEW  Comparison: None.  Findings:  Supine and upright views of the abdomen were obtained. There are multiple clips in the upper abdomen. Overall there is a paucity of gas.  This is a finding that may be within normal limits but also may indicate early ileus or enteritis.  Obstruction is not felt to be likely.  No free air.  There are phleboliths in the pelvis.  IMPRESSION: Paucity of gas, a finding that suggests underlying ileus or enteritis.  Obstruction is not felt to be likely.  No free air appreciated.   Original Report Authenticated By: Arvin Collard.  WOODRUFF III, M.D.      1. Nausea vomiting and diarrhea       MDM  Patient has nausea vomiting diarrhea. Previous colectomy. Lab work is reassuring except for an elevated white count and some mildly elevated hemoglobin. Patient feels better and is tolerated orals after fluid. Doubt obstruction at this time. She'll be discharged home with Zofran will followup as needed.        Juliet Rude. Rubin Payor, MD 12/15/11 1546

## 2011-12-15 NOTE — ED Notes (Signed)
Pt has a j pouch after colectomy 18 years ago.  Pt states she began having emesis this am 2x and then 5x upon arrival before coming back.  Pt states that she has had GI obstruction before that has caused emesis, but feels different than that time.  Pt states he has also had 2 watery stools this am, but she says she does alternate between watery stools and "peanut butter" consistency.  Pt stated she did have some lower abd pain, but decreased after stool.

## 2011-12-15 NOTE — ED Notes (Signed)
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  reports it feels the same. Pt has been having continuous nausea and diarrhea today. Pt reports lower abdominal pain 4/10. Denies dysuria. 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 Pt has no colon.

## 2011-12-15 NOTE — ED Notes (Signed)
MD at bedside. 

## 2012-01-01 ENCOUNTER — Telehealth: Payer: Self-pay | Admitting: Internal Medicine

## 2012-01-01 NOTE — Telephone Encounter (Signed)
Left message that I left samples of Dexilant up front

## 2012-01-21 ENCOUNTER — Telehealth: Payer: Self-pay | Admitting: Internal Medicine

## 2012-01-26 NOTE — Telephone Encounter (Signed)
Called patient and told her I would leave a Dexilant savings card up front for her to see if it would help with the cost.  Patient agreed

## 2012-09-05 ENCOUNTER — Encounter: Payer: Self-pay | Admitting: Family Medicine

## 2012-09-05 ENCOUNTER — Ambulatory Visit (INDEPENDENT_AMBULATORY_CARE_PROVIDER_SITE_OTHER): Payer: PRIVATE HEALTH INSURANCE | Admitting: Family Medicine

## 2012-09-05 VITALS — BP 136/86 | HR 83 | Temp 97.9°F | Wt 204.6 lb

## 2012-09-05 DIAGNOSIS — B3731 Acute candidiasis of vulva and vagina: Secondary | ICD-10-CM

## 2012-09-05 DIAGNOSIS — B373 Candidiasis of vulva and vagina: Secondary | ICD-10-CM

## 2012-09-05 DIAGNOSIS — K219 Gastro-esophageal reflux disease without esophagitis: Secondary | ICD-10-CM

## 2012-09-05 DIAGNOSIS — E119 Type 2 diabetes mellitus without complications: Secondary | ICD-10-CM

## 2012-09-05 DIAGNOSIS — J45901 Unspecified asthma with (acute) exacerbation: Secondary | ICD-10-CM

## 2012-09-05 DIAGNOSIS — J209 Acute bronchitis, unspecified: Secondary | ICD-10-CM

## 2012-09-05 MED ORDER — AMOXICILLIN-POT CLAVULANATE 875-125 MG PO TABS: 1.0000 | ORAL_TABLET | Freq: Two times a day (BID) | ORAL | Status: DC

## 2012-09-05 MED ORDER — PREDNISONE 10 MG PO KIT: PACK | ORAL | Status: DC

## 2012-09-05 MED ORDER — FLUCONAZOLE 150 MG PO TABS: 150.0000 mg | ORAL_TABLET | Freq: Once | ORAL | Status: DC

## 2012-09-05 MED ORDER — PANTOPRAZOLE SODIUM 40 MG PO TBEC: 40.0000 mg | DELAYED_RELEASE_TABLET | Freq: Every day | ORAL | Status: DC

## 2012-09-05 NOTE — Progress Notes (Signed)
Patient ID: Gwendolyn Howard, female   DOB: [birth year], 47 y.o.   MRN: [id number] SUBJECTIVE: CC: Chief Complaint  Patient presents with  . Sinusitis    stuffy nose  chest congestion  requesting prednisone and augmentin   HPI: Has allergies and asthma, and this is a flare up. Patient is the sister of Bjorn Loser. Coughing and wheezing. Feels wet in the left lung like she has phlegm. Allergies flared up. Thinks  She needs prednisone, augmentin, and  Diflucan.  Past Medical History  Diagnosis Date  . Anal stricture   . GERD (gastroesophageal reflux disease)   . Ulcerative colitis   . Small bowel obstruction   . Fibromyalgia   . Anxiety   . Hypoglycemia   . Anemia   . Tumor, foot    Past Surgical History  Procedure Laterality Date  . Total colectomy  12/02/1993  . Proctectomy  12/02/1993  . Knee surgery      lt   History   Social History  . Marital Status: Single    Spouse Name: N/A    Number of Children: 0  . Years of Education: N/A   Occupational History  . RN    Social History Main Topics  . Smoking status: Never Smoker   . Smokeless tobacco: Never Used  . Alcohol Use: Yes     Comment: every 3 to 4 months  . Drug Use: No  . Sexually Active: Not on file   Other Topics Concern  . Not on file   Social History Narrative   Caffeine use daily         Family History  Problem Relation Age of Onset  . Leukemia      1/2 sister  . Heart disease Father   . Diabetes Maternal Grandmother   . Diabetes Paternal Grandmother   . Diabetes Maternal Uncle   . Hypothyroidism Mother   . Irritable bowel syndrome Sister   . Colon cancer Neg Hx   . Diverticulitis Paternal Grandfather    Current Outpatient Prescriptions on File Prior to Visit  Medication Sig Dispense Refill  . beclomethasone (QVAR) 80 MCG/ACT inhaler Inhale 1 puff into the lungs 2 (two) times daily.      . Cyanocobalamin (VITAMIN B-12 IJ) Inject 1,000 mcg as directed every 30 (thirty) days. Last injection  12-05-11      . cyclobenzaprine (FLEXERIL) 10 MG tablet Take 10 mg by mouth 3 (three) times daily as needed.      Marland Kitchen Fexofenadine HCl (ALLEGRA PO) Take 180 mg by mouth daily.       Marland Kitchen lisinopril-hydrochlorothiazide (PRINZIDE,ZESTORETIC) 20-12.5 MG per tablet Take 1 tablet by mouth daily.      . NONFORMULARY OR COMPOUNDED ITEM Immunotherapy 2 injections q week. [facility]      . dexlansoprazole (DEXILANT) 60 MG capsule Take 1 capsule (60 mg total) by mouth daily.  90 capsule  3  . ondansetron (ZOFRAN-ODT) 8 MG disintegrating tablet Take 1 tablet (8 mg total) by mouth every 8 (eight) hours as needed for nausea.  20 tablet  0   No current facility-administered medications on file prior to visit.   Allergies  Allergen Reactions  . Erythromycin   . Mesalamine     There is no immunization history on file for this patient. Prior to Admission medications   Medication Sig Start Date End Date Taking? Authorizing Provider  beclomethasone (QVAR) 80 MCG/ACT inhaler Inhale 1 puff into the lungs 2 (two) times daily.  Yes Historical Provider, MD  Cyanocobalamin (VITAMIN B-12 IJ) Inject 1,000 mcg as directed every 30 (thirty) days. Last injection 12-05-11   Yes Historical Provider, MD  cyclobenzaprine (FLEXERIL) 10 MG tablet Take 10 mg by mouth 3 (three) times daily as needed.   Yes Historical Provider, MD  Fexofenadine HCl (ALLEGRA PO) Take 180 mg by mouth daily.    Yes Historical Provider, MD  lisinopril-hydrochlorothiazide (PRINZIDE,ZESTORETIC) 20-12.5 MG per tablet Take 1 tablet by mouth daily.   Yes Historical Provider, MD  NONFORMULARY OR COMPOUNDED ITEM Immunotherapy 2 injections q week. [facility]   Yes Historical Provider, MD  amoxicillin-clavulanate (AUGMENTIN) 875-125 MG per tablet Take 1 tablet by mouth 2 (two) times daily. 09/05/12   Ileana Ladd, MD  dexlansoprazole (DEXILANT) 60 MG capsule Take 1 capsule (60 mg total) by mouth daily. 11/30/11   Hilarie Fredrickson, MD   fluconazole (DIFLUCAN) 150 MG tablet Take 1 tablet (150 mg total) by mouth once. 09/05/12   Ileana Ladd, MD  ondansetron (ZOFRAN-ODT) 8 MG disintegrating tablet Take 1 tablet (8 mg total) by mouth every 8 (eight) hours as needed for nausea. 12/15/11   Juliet Rude. Pickering, MD  pantoprazole (PROTONIX) 40 MG tablet Take 1 tablet (40 mg total) by mouth daily. 09/05/12   Ileana Ladd, MD  PredniSONE 10 MG KIT 6 days  Dose pack Use as directed 09/05/12   Ileana Ladd, MD    ROS: As above in the HPI. All other systems are stable or negative.  OBJECTIVE: APPEARANCE:  Patient in no acute distress.The patient appeared well nourished and normally developed. Acyanotic. Waist: VITAL SIGNS:BP 136/86  Pulse 83  Temp(Src) 97.9 F (36.6 C) (Oral)  Wt 204 lb 9.6 oz (92.806 kg)  BMI 33.52 kg/m2  LMP 08/06/2012 WF obese.  SKIN: warm and  Dry without overt rashes, tattoos and scars  HEAD and Neck: without JVD, Head and scalp: normal Eyes:No scleral icterus. Fundi normal, eye movements normal. Ears: Auricle normal, canal normal, Tympanic membranes normal, insufflation normal. Nose: normal Throat: normal Neck & thyroid: normal  CHEST & LUNGS: Chest wall: normal Lungs: Clear  CVS: Reveals the PMI to be normally located. Regular rhythm, First and Second Heart sounds are normal,  absence of murmurs, rubs or gallops. Peripheral vasculature: Radial pulses: normal Dorsal pedis pulses: normal Posterior pulses: normal  ABDOMEN:  Appearance: normal Benign, no organomegaly, no masses, no Abdominal Aortic enlargement. No Guarding , no rebound. No Bruits. Bowel sounds: normal  RECTAL: N/A GU: N/A  EXTREMETIES: nonedematous. Both Femoral and Pedal pulses are normal.  MUSCULOSKELETAL:  Spine: normal Joints: intact  NEUROLOGIC: oriented to time,place and person; nonfocal. Strength is normal Sensory is normal Reflexes are normal Cranial Nerves are normal.  ASSESSMENT: Acute  bronchitis - Plan: amoxicillin-clavulanate (AUGMENTIN) 875-125 MG per tablet  Yeast vaginitis - Plan: fluconazole (DIFLUCAN) 150 MG tablet  Asthma with acute exacerbation, unspecified asthma severity - Plan: PredniSONE 10 MG KIT  GERD (gastroesophageal reflux disease) - Plan: pantoprazole (PROTONIX) 40 MG tablet  DM (diabetes mellitus)   PLAN:       Dr Woodroe Mode Recommendations  Diet and Exercise discussed with patient.  For nutrition information, I recommend books:  1).Eat to Live by Dr Monico Hoar. 2).Prevent and Reverse Heart Disease by Dr Suzzette Righter. 3) Dr Katherina Right Book:  Program to Reverse Diabetes  Exercise recommendations are:  If unable to walk, then the patient can exercise in a chair 3 times a day.  By flapping arms like a bird gently and raising legs outwards to the front.  If ambulatory, the patient can go for walks for 30 minutes 3 times a week. Then increase the intensity and duration as tolerated.  Goal is to try to attain exercise frequency to 5 times a week.  If applicable: Best to perform resistance exercises (machines or weights) 2 days a week and cardio type exercises 3 days per week. No orders of the defined types were placed in this encounter.   Meds ordered this encounter  Medications  . DISCONTD: Omeprazole Magnesium (PRILOSEC OTC PO)    Sig: Take 1 tablet by mouth daily.  Marland Kitchen amoxicillin-clavulanate (AUGMENTIN) 875-125 MG per tablet    Sig: Take 1 tablet by mouth 2 (two) times daily.    Dispense:  20 tablet    Refill:  0  . fluconazole (DIFLUCAN) 150 MG tablet    Sig: Take 1 tablet (150 mg total) by mouth once.    Dispense:  1 tablet    Refill:  1  . PredniSONE 10 MG KIT    Sig: 6 days  Dose pack Use as directed    Dispense:  21 each    Refill:  1  . pantoprazole (PROTONIX) 40 MG tablet    Sig: Take 1 tablet (40 mg total) by mouth daily.    Dispense:  30 tablet    Refill:  5   Return if symptoms worsen or fail to  improve.  Diogenes Whirley P. Modesto Charon, M.D.

## 2012-09-05 NOTE — Patient Instructions (Addendum)
      Dr Avik Leoni's Recommendations  Diet and Exercise discussed with patient.  For nutrition information, I recommend books:  1).Eat to Live by Dr Joel Fuhrman. 2).Prevent and Reverse Heart Disease by Dr Caldwell Esselstyn. 3) Dr Neal Barnard's Book:  Program to Reverse Diabetes  Exercise recommendations are:  If unable to walk, then the patient can exercise in a chair 3 times a day. By flapping arms like a bird gently and raising legs outwards to the front.  If ambulatory, the patient can go for walks for 30 minutes 3 times a week. Then increase the intensity and duration as tolerated.  Goal is to try to attain exercise frequency to 5 times a week.  If applicable: Best to perform resistance exercises (machines or weights) 2 days a week and cardio type exercises 3 days per week.  

## 2012-09-06 DIAGNOSIS — B373 Candidiasis of vulva and vagina: Secondary | ICD-10-CM | POA: Insufficient documentation

## 2012-09-06 DIAGNOSIS — B3731 Acute candidiasis of vulva and vagina: Secondary | ICD-10-CM | POA: Insufficient documentation

## 2012-09-06 DIAGNOSIS — J45901 Unspecified asthma with (acute) exacerbation: Secondary | ICD-10-CM | POA: Insufficient documentation

## 2012-09-06 DIAGNOSIS — E119 Type 2 diabetes mellitus without complications: Secondary | ICD-10-CM | POA: Insufficient documentation

## 2012-09-06 DIAGNOSIS — J209 Acute bronchitis, unspecified: Secondary | ICD-10-CM | POA: Insufficient documentation

## 2012-09-06 DIAGNOSIS — K219 Gastro-esophageal reflux disease without esophagitis: Secondary | ICD-10-CM | POA: Insufficient documentation

## 2012-09-12 ENCOUNTER — Other Ambulatory Visit: Payer: Self-pay | Admitting: *Deleted

## 2012-09-12 MED ORDER — METFORMIN HCL 500 MG PO TABS: 500.0000 mg | ORAL_TABLET | Freq: Two times a day (BID) | ORAL | Status: DC

## 2012-09-28 ENCOUNTER — Other Ambulatory Visit: Payer: Self-pay | Admitting: *Deleted

## 2012-09-28 MED ORDER — CYCLOBENZAPRINE HCL 10 MG PO TABS: 10.0000 mg | ORAL_TABLET | Freq: Three times a day (TID) | ORAL | Status: DC | PRN

## 2012-09-28 NOTE — Telephone Encounter (Signed)
Ok to rf per fpw

## 2012-10-24 ENCOUNTER — Ambulatory Visit (INDEPENDENT_AMBULATORY_CARE_PROVIDER_SITE_OTHER): Payer: PRIVATE HEALTH INSURANCE | Admitting: Family Medicine

## 2012-10-24 ENCOUNTER — Encounter: Payer: Self-pay | Admitting: Family Medicine

## 2012-10-24 VITALS — BP 136/81 | HR 85 | Temp 97.4°F | Wt 207.8 lb

## 2012-10-24 DIAGNOSIS — E538 Deficiency of other specified B group vitamins: Secondary | ICD-10-CM

## 2012-10-24 DIAGNOSIS — D508 Other iron deficiency anemias: Secondary | ICD-10-CM

## 2012-10-24 DIAGNOSIS — IMO0002 Reserved for concepts with insufficient information to code with codable children: Secondary | ICD-10-CM

## 2012-10-24 DIAGNOSIS — S46912A Strain of unspecified muscle, fascia and tendon at shoulder and upper arm level, left arm, initial encounter: Secondary | ICD-10-CM

## 2012-10-24 LAB — POCT CBC
Granulocyte percent: 74.3 %G (ref 37–80)
HCT, POC: 41.6 % (ref 37.7–47.9)
Hemoglobin: 13.8 g/dL (ref 12.2–16.2)
Lymph, poc: 2.2 (ref 0.6–3.4)
MCH, POC: 29.8 pg (ref 27–31.2)
MCHC: 33.1 g/dL (ref 31.8–35.4)
MCV: 90.1 fL (ref 80–97)
MPV: 7.7 fL (ref 0–99.8)
POC Granulocyte: 6.5 (ref 2–6.9)
POC LYMPH PERCENT: 24.7 %L (ref 10–50)
Platelet Count, POC: 363 10*3/uL (ref 142–424)
RBC: 4.6 M/uL (ref 4.04–5.48)
RDW, POC: 13.6 %
WBC: 8.8 10*3/uL (ref 4.6–10.2)

## 2012-10-24 MED ORDER — CELECOXIB 200 MG PO CAPS: 200.0000 mg | ORAL_CAPSULE | Freq: Every day | ORAL | Status: DC

## 2012-10-24 NOTE — Progress Notes (Signed)
Patient ID: Gwendolyn Howard, female   DOB: 10-25-1965, 47 y.o.   MRN: [id number] SUBJECTIVE: CC: Chief Complaint  Patient presents with  . Follow-up    reck iron level states having left shoulder pain clicking and popping 2 weeks ago. needs note for light duty till seen by orthro .    HPI: 2 weeks ago has had a funny clicking and soreness under the left scapula area, when she reaches forwaer or pushing forward. No neurologic weakness.  Breathing is good at present.  H/o anemia with low iron, B12 and Vit D low. Needs labs to recheck.  Past Medical History  Diagnosis Date  . Anal stricture   . GERD (gastroesophageal reflux disease)   . Ulcerative colitis   . Small bowel obstruction   . Fibromyalgia   . Anxiety   . Hypoglycemia   . Anemia   . Tumor, foot    Past Surgical History  Procedure Laterality Date  . Total colectomy  12/02/1993  . Proctectomy  12/02/1993  . Knee surgery      lt   History   Social History  . Marital Status: Single    Spouse Name: N/A    Number of Children: 0  . Years of Education: N/A   Occupational History  . RN    Social History Main Topics  . Smoking status: Never Smoker   . Smokeless tobacco: Never Used  . Alcohol Use: Yes     Comment: every 3 to 4 months  . Drug Use: No  . Sexual Activity: Not on file   Other Topics Concern  . Not on file   Social History Narrative   Caffeine use daily         Family History  Problem Relation Age of Onset  . Leukemia      1/2 sister  . Heart disease Father   . Diabetes Maternal Grandmother   . Diabetes Paternal Grandmother   . Diabetes Maternal Uncle   . Hypothyroidism Mother   . Irritable bowel syndrome Sister   . Colon cancer Neg Hx   . Diverticulitis Paternal Grandfather    Current Outpatient Prescriptions on File Prior to Visit  Medication Sig Dispense Refill  . beclomethasone (QVAR) 80 MCG/ACT inhaler Inhale 1 puff into the lungs 2 (two) times daily.      . Cyanocobalamin  (VITAMIN B-12 IJ) Inject 1,000 mcg as directed every 30 (thirty) days. Last injection 12-05-11      . cyclobenzaprine (FLEXERIL) 10 MG tablet Take 1 tablet (10 mg total) by mouth 3 (three) times daily as needed.  30 tablet  0  . Fexofenadine HCl (ALLEGRA PO) Take 180 mg by mouth daily.       Marland Kitchen lisinopril-hydrochlorothiazide (PRINZIDE,ZESTORETIC) 20-12.5 MG per tablet Take 1 tablet by mouth daily.      . metFORMIN (GLUCOPHAGE) 500 MG tablet Take 1 tablet (500 mg total) by mouth 2 (two) times daily with a meal.  90 tablet  1  . NONFORMULARY OR COMPOUNDED ITEM Immunotherapy 2 injections q week. [facility]      . ondansetron (ZOFRAN-ODT) 8 MG disintegrating tablet Take 1 tablet (8 mg total) by mouth every 8 (eight) hours as needed for nausea.  20 tablet  0  . pantoprazole (PROTONIX) 40 MG tablet Take 1 tablet (40 mg total) by mouth daily.  30 tablet  5   No current facility-administered medications on file prior to visit.   Allergies  Allergen Reactions  .  Erythromycin   . Mesalamine    Immunization History  Administered Date(s) Administered  . Tdap 07/01/2003   Prior to Admission medications   Medication Sig Start Date End Date Taking? Authorizing Provider  beclomethasone (QVAR) 80 MCG/ACT inhaler Inhale 1 puff into the lungs 2 (two) times daily.   Yes Historical Provider, MD  Cyanocobalamin (VITAMIN B-12 IJ) Inject 1,000 mcg as directed every 30 (thirty) days. Last injection 12-05-11   Yes Historical Provider, MD  cyclobenzaprine (FLEXERIL) 10 MG tablet Take 1 tablet (10 mg total) by mouth 3 (three) times daily as needed. 09/28/12  Yes Ileana Ladd, MD  Fexofenadine HCl (ALLEGRA PO) Take 180 mg by mouth daily.    Yes Historical Provider, MD  lisinopril-hydrochlorothiazide (PRINZIDE,ZESTORETIC) 20-12.5 MG per tablet Take 1 tablet by mouth daily.   Yes Historical Provider, MD  metFORMIN (GLUCOPHAGE) 500 MG tablet Take 1 tablet (500 mg total) by mouth 2 (two) times daily with a  meal. 09/12/12  Yes Ileana Ladd, MD  montelukast (SINGULAIR) 10 MG tablet Take 10 mg by mouth at bedtime.   Yes Historical Provider, MD  NONFORMULARY OR COMPOUNDED ITEM Immunotherapy 2 injections q week. [facility]   Yes Historical Provider, MD  ondansetron (ZOFRAN-ODT) 8 MG disintegrating tablet Take 1 tablet (8 mg total) by mouth every 8 (eight) hours as needed for nausea. 12/15/11  Yes Nathan R. Pickering, MD  pantoprazole (PROTONIX) 40 MG tablet Take 1 tablet (40 mg total) by mouth daily. 09/05/12  Yes Ileana Ladd, MD  celecoxib (CELEBREX) 200 MG capsule Take 1 capsule (200 mg total) by mouth daily. 10/24/12   Ileana Ladd, MD      ROS: As above in the HPI. All other systems are stable or negative.  OBJECTIVE: APPEARANCE:  Patient in no acute distress.The patient appeared well nourished and normally developed. Acyanotic. Waist: VITAL SIGNS:BP 136/81  Pulse 85  Temp(Src) 97.4 F (36.3 C) (Oral)  Wt 207 lb 12.8 oz (94.257 kg)  BMI 34.04 kg/m2  LMP 10/17/2012 WF overweight appears better than the last time I saw her.   SKIN: warm and  Dry without overt rashes, tattoos and scars  HEAD and Neck: without JVD, Head and scalp: normal Eyes:No scleral icterus. Fundi normal, eye movements normal. Ears: Auricle normal, canal normal, Tympanic membranes normal, insufflation normal. Nose: normal Throat: normal Neck & thyroid: normal  CHEST & LUNGS: Chest wall: normal Lungs: Clear  CVS: Reveals the PMI to be normally located. Regular rhythm, First and Second Heart sounds are normal,  absence of murmurs, rubs or gallops. Peripheral vasculature: Radial pulses: normal Dorsal pedis pulses: normal Posterior pulses: normal  ABDOMEN:  Appearance: normal Benign, no organomegaly, no masses, no Abdominal Aortic enlargement. No Guarding , no rebound. No Bruits. Bowel sounds: normal  RECTAL: N/A GU: N/A  EXTREMETIES: nonedematous.  MUSCULOSKELETAL:  Spine:  normal Joints: left shoulder girdle area especially the scapula is a little tender. Push-pull movements exacerbates symptoms.   NEUROLOGIC: oriented to time,place and person; nonfocal. Strength is normal Sensory is normal Reflexes are normal Cranial Nerves are normal.  ASSESSMENT: Other specified iron deficiency anemias - Plan: Vit D  25 hydroxy (rtn osteoporosis monitoring), Ferritin, Iron and TIBC, POCT CBC  B12 deficiency - Plan: Vitamin B12  Left shoulder strain, initial encounter - Plan: celecoxib (CELEBREX) 200 MG capsule   PLAN: Ice, Biofreeze Orders Placed This Encounter  Procedures  . Vit D  25 hydroxy (rtn osteoporosis monitoring)  . Vitamin B12  .  Ferritin  . Iron and TIBC  . POCT CBC    Meds ordered this encounter  Medications  . montelukast (SINGULAIR) 10 MG tablet    Sig: Take 10 mg by mouth at bedtime.  . celecoxib (CELEBREX) 200 MG capsule    Sig: Take 1 capsule (200 mg total) by mouth daily.    Dispense:  30 capsule    Refill:  1    Await labs.  leeter for light duty in regards to the left shoulder.  Return if symptoms worsen or fail to improve.  Kayshaun Polanco P. Modesto Charon, M.D.

## 2012-10-25 ENCOUNTER — Telehealth: Payer: Self-pay | Admitting: Family Medicine

## 2012-10-25 LAB — IRON AND TIBC
Iron Saturation: 23 % (ref 15–55)
Iron: 88 ug/dL (ref 35–155)
TIBC: 379 ug/dL (ref 250–450)
UIBC: 291 ug/dL (ref 150–375)

## 2012-10-25 LAB — VITAMIN D 25 HYDROXY (VIT D DEFICIENCY, FRACTURES): Vit D, 25-Hydroxy: 13.3 ng/mL — ABNORMAL LOW (ref 30.0–100.0)

## 2012-10-25 LAB — FERRITIN: Ferritin: 43 ng/mL (ref 15–150)

## 2012-10-25 LAB — VITAMIN B12: Vitamin B-12: 914 pg/mL (ref 211–946)

## 2012-10-25 NOTE — Telephone Encounter (Signed)
Pt notifed --lab results not reviewed yet by Dr Modesto Charon .

## 2012-10-28 NOTE — Progress Notes (Signed)
Quick Note:  Labs abnormal.Vitamin D is very low. Needs to start on Vitamin D 4000 units Daily OTC medication will be fine. Will recheck at a follow up visit in 3 months.  The rest of the labs are fine. No anemia and the Vitamin B12 is fine. ______

## 2012-11-01 ENCOUNTER — Telehealth: Payer: Self-pay | Admitting: Family Medicine

## 2012-11-01 NOTE — Telephone Encounter (Signed)
Pt aware of labs  

## 2012-11-25 ENCOUNTER — Other Ambulatory Visit: Payer: Self-pay | Admitting: Family Medicine

## 2012-11-25 ENCOUNTER — Telehealth: Payer: Self-pay

## 2012-11-25 MED ORDER — MONTELUKAST SODIUM 10 MG PO TABS: 10.0000 mg | ORAL_TABLET | Freq: Every day | ORAL | Status: DC

## 2012-11-25 MED ORDER — CYCLOBENZAPRINE HCL 10 MG PO TABS: 10.0000 mg | ORAL_TABLET | Freq: Three times a day (TID) | ORAL | Status: DC | PRN

## 2012-11-25 NOTE — Telephone Encounter (Signed)
Pt called wants rx for singulair  10mg   Send to [facility]  [address] [place]   Needs today    Also wants a year rx for flexeril and singulair from Dr Modesto Charon.

## 2012-12-05 ENCOUNTER — Other Ambulatory Visit: Payer: Self-pay | Admitting: Family Medicine

## 2012-12-05 DIAGNOSIS — M62838 Other muscle spasm: Secondary | ICD-10-CM

## 2012-12-05 MED ORDER — CYCLOBENZAPRINE HCL 10 MG PO TABS: 10.0000 mg | ORAL_TABLET | Freq: Three times a day (TID) | ORAL | Status: DC | PRN

## 2012-12-05 NOTE — Telephone Encounter (Signed)
Prescription renewed in [facility]. 

## 2012-12-13 ENCOUNTER — Encounter: Payer: Self-pay | Admitting: Family Medicine

## 2012-12-13 ENCOUNTER — Ambulatory Visit (INDEPENDENT_AMBULATORY_CARE_PROVIDER_SITE_OTHER): Payer: PRIVATE HEALTH INSURANCE | Admitting: Family Medicine

## 2012-12-13 VITALS — BP 136/85 | HR 87 | Temp 97.0°F | Wt 202.8 lb

## 2012-12-13 DIAGNOSIS — E119 Type 2 diabetes mellitus without complications: Secondary | ICD-10-CM

## 2012-12-13 DIAGNOSIS — E785 Hyperlipidemia, unspecified: Secondary | ICD-10-CM

## 2012-12-13 DIAGNOSIS — K219 Gastro-esophageal reflux disease without esophagitis: Secondary | ICD-10-CM

## 2012-12-13 LAB — POCT GLYCOSYLATED HEMOGLOBIN (HGB A1C): Hemoglobin A1C: 5.8

## 2012-12-13 MED ORDER — METFORMIN HCL 500 MG PO TABS: 500.0000 mg | ORAL_TABLET | Freq: Every day | ORAL | Status: DC

## 2012-12-13 NOTE — Patient Instructions (Signed)
Diabetes and Foot Care Diabetes may cause you to have a poor blood supply (circulation) to your legs and feet. Because of this, the skin may be thinner, break easier, and heal more slowly. You also may have nerve damage in your legs and feet causing decreased feeling. You may not notice minor injuries to your feet that could lead to serious problems or infections. Taking care of your feet is one of the most important things you can do for yourself.  HOME CARE INSTRUCTIONS  Do not go barefoot. Bare feet are easily injured.  Check your feet daily for blisters, cuts, and redness.  Wash your feet with warm water (not hot) and mild soap. Pat your feet and between your toes until completely dry.  Apply a moisturizing lotion that does not contain alcohol or petroleum jelly to the dry skin on your feet and to dry brittle toenails. Do not put it between your toes.  Trim your toenails straight across. Do not dig under them or around the cuticle.  Do not cut corns or calluses, or try to remove them with medicine.  Wear clean cotton socks or stockings every day. Make sure they are not too tight. Do not wear knee high stockings since they may decrease blood flow to your legs.  Wear leather shoes that fit properly and have enough cushioning. To break in new shoes, wear them just a few hours a day to avoid injuring your feet.  Wear shoes at all times, even in the house.  Do not cross your legs. This may decrease the blood flow to your feet.  If you find a minor scrape, cut, or break in the skin on your feet, keep it and the skin around it clean and dry. These areas may be cleansed with mild soap and water. Do not use peroxide, alcohol, iodine or Merthiolate.  When you remove an adhesive bandage, be sure not to harm the skin around it.  If you have a wound, look at it several times a day to make sure it is healing.  Do not use heating pads or hot water bottles. Burns can occur. If you have lost feeling  in your feet or legs, you may not know it is happening until it is too late.  Report any cuts, sores or bruises to your caregiver. Do not wait! SEEK MEDICAL CARE IF:   You have an injury that is not healing or you notice redness, numbness, burning, or tingling.  Your feet always feel cold.  You have pain or cramps in your legs and feet. SEEK IMMEDIATE MEDICAL CARE IF:   There is increasing redness, swelling, or increasing pain in the wound.  There is a red line that goes up your leg.  Pus is coming from a wound.  You develop an unexplained oral temperature above 102 F (38.9 C), or as your caregiver suggests.  You notice a bad smell coming from an ulcer or wound. MAKE SURE YOU:   Understand these instructions.  Will watch your condition.  Will get help right away if you are not doing well or get worse. Document Released: 02/14/2000 Document Revised: 05/11/2011 Document Reviewed: 08/22/2008 [facility] Patient Information 2014 [place], [place].   Diabetes, Frequently Asked Questions WHAT IS DIABETES? Most of the food we eat is turned into glucose (sugar). Our bodies use it for energy. The pancreas makes a hormone called insulin. It helps glucose get into the cells of our bodies. When you have diabetes, your body either does  not make enough insulin or cannot use its own insulin as well as it should. This causes sugars to build up in your blood. WHAT ARE THE SYMPTOMS OF DIABETES?  Frequent urination.  Excessive thirst.  Unexplained weight loss.  Extreme hunger.  Blurred vision.  Tingling or numbness in hands or feet.  Feeling very tired much of the time.  Dry, itchy skin.  Sores that are slow to heal.  Yeast infections. WHAT ARE THE TYPES OF DIABETES? Type 1 Diabetes   About 10% of affected people have this type.  Usually occurs before the age of 21.  Usually occurs in thin to normal weight people. Type 2 Diabetes  About 90% of affected people have this  type.  Usually occurs after the age of 48.  Usually occurs in overweight people.  More likely to have:  A family history of diabetes.  A history of diabetes during pregnancy (gestational diabetes).  High blood pressure.  High cholesterol and triglycerides. Gestational Diabetes  Occurs in about 4% of pregnancies.  Usually goes away after the baby is born.  More likely to occur in women with:  Family history of diabetes.  Previous gestational diabetes.  Obese.  Over 32 years old. WHAT IS PRE-DIABETES? Pre-diabetes means your blood glucose is higher than normal, but lower than the diabetes range. It also means you are at risk of getting type 2 diabetes and heart disease. If you are told you have pre-diabetes, have your blood glucose checked again in 1 to 2 years. WHAT IS THE TREATMENT FOR DIABETES? Treatment is aimed at keeping blood glucose near normal levels at all times. Learning how to manage this yourself is important in treating diabetes. Depending on the type of diabetes you have, your treatment will include one or more of the following:  Monitoring your blood glucose.  Meal planning.  Exercise.  Oral medicine (pills) or insulin. CAN DIABETES BE PREVENTED? With type 1 diabetes, prevention is more difficult, because the triggers that cause it are not yet known. With type 2 diabetes, prevention is more likely, with lifestyle changes:  Maintain a healthy weight.  Eat healthy.  Exercise. IS THERE A CURE FOR DIABETES? No, there is no cure for diabetes. There is a lot of research going on that is looking for a cure, and progress is being made. Diabetes can be treated and controlled. People with diabetes can manage their diabetes and lead normal, active lives. SHOULD I BE TESTED FOR DIABETES? If you are at least 48 years old, you should be tested for diabetes. You should be tested again every 3 years. If you are 45 or older and overweight, you may want to get tested  more often. If you are younger than 45, overweight, and have one or more of the following risk factors, you should be tested:  Family history of diabetes.  Inactive lifestyle.  High blood pressure. WHAT ARE SOME OTHER SOURCES FOR INFORMATION ON DIABETES? The following organizations may help in your search for more information on diabetes: National Diabetes Education Program (NDEP) Internet: SolarDiscussions.es American Diabetes Association Internet: http://www.diabetes.org  Juvenile Diabetes Foundation International Internet: WetlessWash.is Document Released: 02/19/2003 Document Revised: 05/11/2011 Document Reviewed: 12/14/2008 [facility] Patient Information 2014 [place], [place].

## 2012-12-13 NOTE — Progress Notes (Signed)
Patient ID: Gwendolyn Howard, female   DOB: May 26, 1965, 47 y.o.   MRN: [id number] SUBJECTIVE: CC: Chief Complaint  Patient presents with  . Follow-up    1 month needs glucophage  rx no complaints fastng and would like rx for 1 yr BS this am  137    HPI: Patient is here for follow up of Diabetes Mellitus: Symptoms evaluated: Denies Nocturia ,Denies Urinary Frequency , denies Blurred vision ,deniesDizziness,denies.Dysuria,denies paresthesias, denies extremity pain or ulcers.Marland Kitchendenies chest pain. has had an annual eye exam. do check the feet. Does check CBGs. Average NWG:NFAOZ better 190 after a meal. Denies episodes of hypoglycemia. Does have an emergency hypoglycemic plan. admits toCompliance with medications. Denies Problems with medications.  Past Medical History  Diagnosis Date  . Anal stricture   . GERD (gastroesophageal reflux disease)   . Ulcerative colitis   . Small bowel obstruction   . Fibromyalgia   . Anxiety   . Hypoglycemia   . Anemia   . Tumor, foot    Past Surgical History  Procedure Laterality Date  . Total colectomy  12/02/1993  . Proctectomy  12/02/1993  . Knee surgery      lt   History   Social History  . Marital Status: Single    Spouse Name: N/A    Number of Children: 0  . Years of Education: N/A   Occupational History  . RN    Social History Main Topics  . Smoking status: Never Smoker   . Smokeless tobacco: Never Used  . Alcohol Use: Yes     Comment: every 3 to 4 months  . Drug Use: No  . Sexual Activity: Not on file   Other Topics Concern  . Not on file   Social History Narrative   Caffeine use daily         Family History  Problem Relation Age of Onset  . Leukemia      1/2 sister  . Heart disease Father   . Diabetes Maternal Grandmother   . Diabetes Paternal Grandmother   . Diabetes Maternal Uncle   . Hypothyroidism Mother   . Irritable bowel syndrome Sister   . Colon cancer Neg Hx   . Diverticulitis Paternal Grandfather     Current Outpatient Prescriptions on File Prior to Visit  Medication Sig Dispense Refill  . beclomethasone (QVAR) 80 MCG/ACT inhaler Inhale 1 puff into the lungs 2 (two) times daily.      . celecoxib (CELEBREX) 200 MG capsule Take 1 capsule (200 mg total) by mouth daily.  30 capsule  1  . Cyanocobalamin (VITAMIN B-12 IJ) Inject 1,000 mcg as directed every 30 (thirty) days. Last injection 12-05-11      . cyclobenzaprine (FLEXERIL) 10 MG tablet Take 1 tablet (10 mg total) by mouth 3 (three) times daily as needed.  30 tablet  5  . Fexofenadine HCl (ALLEGRA PO) Take 180 mg by mouth daily.       Marland Kitchen lisinopril-hydrochlorothiazide (PRINZIDE,ZESTORETIC) 20-12.5 MG per tablet Take 1 tablet by mouth daily.      . metFORMIN (GLUCOPHAGE) 500 MG tablet Take 1 tablet (500 mg total) by mouth 2 (two) times daily with a meal.  90 tablet  1  . montelukast (SINGULAIR) 10 MG tablet Take 1 tablet (10 mg total) by mouth at bedtime.  30 tablet  11  . NONFORMULARY OR COMPOUNDED ITEM Immunotherapy 2 injections q week. [facility]      . ondansetron (ZOFRAN-ODT) 8 MG disintegrating tablet  Take 1 tablet (8 mg total) by mouth every 8 (eight) hours as needed for nausea.  20 tablet  0  . pantoprazole (PROTONIX) 40 MG tablet Take 1 tablet (40 mg total) by mouth daily.  30 tablet  5   No current facility-administered medications on file prior to visit.   Allergies  Allergen Reactions  . Erythromycin   . Mesalamine    Immunization History  Administered Date(s) Administered  . Tdap 07/01/2003   Prior to Admission medications   Medication Sig Start Date End Date Taking? Authorizing Provider  beclomethasone (QVAR) 80 MCG/ACT inhaler Inhale 1 puff into the lungs 2 (two) times daily.   Yes Historical Provider, MD  celecoxib (CELEBREX) 200 MG capsule Take 1 capsule (200 mg total) by mouth daily. 10/24/12  Yes Ileana Ladd, MD  Cyanocobalamin (VITAMIN B-12 IJ) Inject 1,000 mcg as directed every 30 (thirty)  days. Last injection 12-05-11   Yes Historical Provider, MD  cyclobenzaprine (FLEXERIL) 10 MG tablet Take 1 tablet (10 mg total) by mouth 3 (three) times daily as needed. 12/05/12  Yes Ileana Ladd, MD  Fexofenadine HCl (ALLEGRA PO) Take 180 mg by mouth daily.    Yes Historical Provider, MD  lisinopril-hydrochlorothiazide (PRINZIDE,ZESTORETIC) 20-12.5 MG per tablet Take 1 tablet by mouth daily.   Yes Historical Provider, MD  metFORMIN (GLUCOPHAGE) 500 MG tablet Take 1 tablet (500 mg total) by mouth 2 (two) times daily with a meal. 09/12/12  Yes Ileana Ladd, MD  montelukast (SINGULAIR) 10 MG tablet Take 1 tablet (10 mg total) by mouth at bedtime. 11/25/12  Yes Deatra Canter, FNP  NONFORMULARY OR COMPOUNDED ITEM Immunotherapy 2 injections q week. [facility]   Yes Historical Provider, MD  ondansetron (ZOFRAN-ODT) 8 MG disintegrating tablet Take 1 tablet (8 mg total) by mouth every 8 (eight) hours as needed for nausea. 12/15/11  Yes Nathan R. Pickering, MD  pantoprazole (PROTONIX) 40 MG tablet Take 1 tablet (40 mg total) by mouth daily. 09/05/12  Yes Ileana Ladd, MD     ROS: As above in the HPI. All other systems are stable or negative.  OBJECTIVE: APPEARANCE:  Patient in no acute distress.The patient appeared well nourished and normally developed. Acyanotic. Waist: VITAL SIGNS:BP 136/85  Pulse 87  Temp(Src) 97 F (36.1 C) (Oral)  Wt 202 lb 12.8 oz (91.989 kg)  BMI 33.22 kg/m2 WF  SKIN: warm and  Dry without overt rashes, tattoos and scars  HEAD and Neck: without JVD, Head and scalp: normal Eyes:No scleral icterus. Fundi normal, eye movements normal. Ears: Auricle normal, canal normal, Tympanic membranes normal, insufflation normal. Nose: normal Throat: normal Neck & thyroid: normal  CHEST & LUNGS: Chest wall: normal Lungs: Clear  CVS: Reveals the PMI to be normally located. Regular rhythm, First and Second Heart sounds are normal,  absence of murmurs,  rubs or gallops. Peripheral vasculature: Radial pulses: normal Dorsal pedis pulses: normal Posterior pulses: normal  ABDOMEN:  Appearance: Obese Benign, no organomegaly, no masses, no Abdominal Aortic enlargement. No Guarding , no rebound. No Bruits. Bowel sounds: normal  RECTAL: N/A GU: N/A  EXTREMETIES: nonedematous.  MUSCULOSKELETAL:  Spine: normal Joints: intact  NEUROLOGIC: oriented to time,place and person; nonfocal. Strength is normal Sensory is normal Reflexes are normal Cranial Nerves are normal.  ASSESSMENT: DM (diabetes mellitus) - Plan: metFORMIN (GLUCOPHAGE) 500 MG tablet, POCT glycosylated hemoglobin (Hb A1C), CMP14+EGFR  Esophageal reflux  GERD (gastroesophageal reflux disease)  HLD (hyperlipidemia) - Plan: Lipid panel  PLAN:  Orders Placed This Encounter  Procedures  . CMP14+EGFR  . Lipid panel  . POCT glycosylated hemoglobin (Hb A1C)   Meds ordered this encounter  Medications  . metFORMIN (GLUCOPHAGE) 500 MG tablet    Sig: Take 1 tablet (500 mg total) by mouth at bedtime.    Dispense:  90 tablet    Refill:  3   Handouts on DM given in the AVS.  Return in about 3 months (around 03/15/2013) for Recheck medical problems.  Mylon Mabey P. Modesto Charon, M.D.

## 2012-12-14 LAB — LIPID PANEL
Chol/HDL Ratio: 2.7 ratio units (ref 0.0–4.4)
Cholesterol, Total: 169 mg/dL (ref 100–199)
HDL: 63 mg/dL (ref >39)
LDL Calculated: 78 mg/dL (ref 0–99)
Triglycerides: 140 mg/dL (ref 0–149)
VLDL Cholesterol Cal: 28 mg/dL (ref 5–40)

## 2012-12-14 LAB — CMP14+EGFR
ALT: 24 IU/L (ref 0–32)
AST: 19 IU/L (ref 0–40)
Albumin/Globulin Ratio: 1.6 (ref 1.1–2.5)
Albumin: 4.4 g/dL (ref 3.5–5.5)
Alkaline Phosphatase: 54 IU/L (ref 39–117)
BUN/Creatinine Ratio: 14 (ref 9–23)
BUN: 10 mg/dL (ref 6–24)
CO2: 23 mmol/L (ref 18–29)
Calcium: 10.2 mg/dL (ref 8.7–10.2)
Chloride: 94 mmol/L — ABNORMAL LOW (ref 97–108)
Creatinine, Ser: 0.69 mg/dL (ref 0.57–1.00)
GFR calc Af Amer: 120 mL/min/{1.73_m2} (ref >59)
GFR calc non Af Amer: 104 mL/min/{1.73_m2} (ref >59)
Globulin, Total: 2.8 g/dL (ref 1.5–4.5)
Glucose: 97 mg/dL (ref 65–99)
Potassium: 4.4 mmol/L (ref 3.5–5.2)
Sodium: 136 mmol/L (ref 134–144)
Total Bilirubin: 0.6 mg/dL (ref 0.0–1.2)
Total Protein: 7.2 g/dL (ref 6.0–8.5)

## 2013-01-05 ENCOUNTER — Other Ambulatory Visit: Payer: Self-pay

## 2013-01-30 ENCOUNTER — Other Ambulatory Visit: Payer: Self-pay | Admitting: Family Medicine

## 2013-01-30 ENCOUNTER — Telehealth: Payer: Self-pay | Admitting: Family Medicine

## 2013-01-30 DIAGNOSIS — J441 Chronic obstructive pulmonary disease with (acute) exacerbation: Secondary | ICD-10-CM

## 2013-01-30 MED ORDER — AMOXICILLIN-POT CLAVULANATE 875-125 MG PO TABS: 1.0000 | ORAL_TABLET | Freq: Two times a day (BID) | ORAL | Status: DC

## 2013-01-30 NOTE — Telephone Encounter (Signed)
Call patient : Prescription refilled & sent to pharmacy in [facility]. 

## 2013-02-06 ENCOUNTER — Other Ambulatory Visit: Payer: Self-pay | Admitting: Family Medicine

## 2013-02-06 ENCOUNTER — Telehealth: Payer: Self-pay | Admitting: *Deleted

## 2013-02-06 MED ORDER — CYANOCOBALAMIN 1000 MCG/ML IJ SOLN: 1000.0000 ug | INTRAMUSCULAR | Status: DC

## 2013-02-06 NOTE — Telephone Encounter (Signed)
Spoke with pt and would like Gwendolyn Howard (sister )to pick up rx for her to mail

## 2013-02-06 NOTE — Telephone Encounter (Signed)
Pt needs B12 med refilled and needles for syringes?Not sure what size? She says she needs asap, uses [facility]

## 2013-02-06 NOTE — Telephone Encounter (Signed)
Needs to pick up Rx for syringes and needles. Couldn't figure out which to order. Ordered the B12 through [facility].  Virtie Bungert P. Modesto Charon, M.D.

## 2013-03-21 ENCOUNTER — Other Ambulatory Visit: Payer: Self-pay | Admitting: Family Medicine

## 2013-03-21 ENCOUNTER — Telehealth: Payer: Self-pay | Admitting: Family Medicine

## 2013-03-21 MED ORDER — PREDNISONE 20 MG PO TABS: 60.0000 mg | ORAL_TABLET | Freq: Every day | ORAL | Status: DC

## 2013-03-21 NOTE — Telephone Encounter (Signed)
Rx ready for nurse to Phone in to [place].

## 2013-03-21 NOTE — Telephone Encounter (Signed)
DUPLICATE MESSAGE

## 2013-03-21 NOTE — Telephone Encounter (Signed)
Rhonda aware that rx for prednisone was called to [facility]

## 2013-04-11 ENCOUNTER — Ambulatory Visit (INDEPENDENT_AMBULATORY_CARE_PROVIDER_SITE_OTHER): Payer: PRIVATE HEALTH INSURANCE | Admitting: Family Medicine

## 2013-04-11 ENCOUNTER — Encounter: Payer: Self-pay | Admitting: Family Medicine

## 2013-04-11 VITALS — BP 127/83 | HR 77 | Temp 98.4°F | Ht 65.0 in | Wt 207.2 lb

## 2013-04-11 DIAGNOSIS — Z13220 Encounter for screening for lipoid disorders: Secondary | ICD-10-CM

## 2013-04-11 DIAGNOSIS — R5381 Other malaise: Secondary | ICD-10-CM

## 2013-04-11 DIAGNOSIS — D649 Anemia, unspecified: Secondary | ICD-10-CM

## 2013-04-11 DIAGNOSIS — R5383 Other fatigue: Secondary | ICD-10-CM

## 2013-04-11 DIAGNOSIS — E162 Hypoglycemia, unspecified: Secondary | ICD-10-CM | POA: Insufficient documentation

## 2013-04-11 DIAGNOSIS — K5190 Ulcerative colitis, unspecified, without complications: Secondary | ICD-10-CM | POA: Insufficient documentation

## 2013-04-11 DIAGNOSIS — B354 Tinea corporis: Secondary | ICD-10-CM

## 2013-04-11 DIAGNOSIS — M791 Myalgia, unspecified site: Secondary | ICD-10-CM

## 2013-04-11 DIAGNOSIS — M62838 Other muscle spasm: Secondary | ICD-10-CM

## 2013-04-11 DIAGNOSIS — D4989 Neoplasm of unspecified behavior of other specified sites: Secondary | ICD-10-CM | POA: Insufficient documentation

## 2013-04-11 DIAGNOSIS — M797 Fibromyalgia: Secondary | ICD-10-CM | POA: Insufficient documentation

## 2013-04-11 DIAGNOSIS — K219 Gastro-esophageal reflux disease without esophagitis: Secondary | ICD-10-CM

## 2013-04-11 DIAGNOSIS — K56609 Unspecified intestinal obstruction, unspecified as to partial versus complete obstruction: Secondary | ICD-10-CM | POA: Insufficient documentation

## 2013-04-11 DIAGNOSIS — E119 Type 2 diabetes mellitus without complications: Secondary | ICD-10-CM

## 2013-04-11 DIAGNOSIS — K624 Stenosis of anus and rectum: Secondary | ICD-10-CM | POA: Insufficient documentation

## 2013-04-11 DIAGNOSIS — F419 Anxiety disorder, unspecified: Secondary | ICD-10-CM | POA: Insufficient documentation

## 2013-04-11 DIAGNOSIS — J45909 Unspecified asthma, uncomplicated: Secondary | ICD-10-CM | POA: Insufficient documentation

## 2013-04-11 DIAGNOSIS — IMO0001 Reserved for inherently not codable concepts without codable children: Secondary | ICD-10-CM

## 2013-04-11 LAB — POCT UA - MICROALBUMIN: Microalbumin Ur, POC: NEGATIVE mg/L

## 2013-04-11 LAB — POCT GLYCOSYLATED HEMOGLOBIN (HGB A1C): Hemoglobin A1C: 5.6

## 2013-04-11 MED ORDER — CYCLOBENZAPRINE HCL 10 MG PO TABS: 10.0000 mg | ORAL_TABLET | Freq: Three times a day (TID) | ORAL | Status: DC | PRN

## 2013-04-11 MED ORDER — KETOCONAZOLE 2 % EX CREA: 1.0000 "application " | TOPICAL_CREAM | Freq: Every day | CUTANEOUS | Status: AC

## 2013-04-11 MED ORDER — FLUCONAZOLE 150 MG PO TABS: 150.0000 mg | ORAL_TABLET | Freq: Once | ORAL | Status: DC

## 2013-04-11 NOTE — Progress Notes (Signed)
Patient ID: Gwendolyn Howard, female   DOB: [birth year], 48 y.o.   MRN: [id number] SUBJECTIVE: CC: Chief Complaint  Patient presents with  . Follow-up    c/o rash      HPI: Rash itching in th eleft axillary area and under the breasts.  Patient is here for follow up of Diabetes Mellitus: Symptoms evaluated: Denies Nocturia ,Denies Urinary Frequency , denies Blurred vision ,deniesDizziness,denies.Dysuria,denies paresthesias, denies extremity pain or ulcers.Marland Kitchendenies chest pain. has had an annual eye exam. do check the feet. Does check CBGs. Average RJP:VGKK. Depends on her meals. Denies episodes of hypoglycemia. Does have an emergency hypoglycemic plan. admits toCompliance with medications. Denies Problems with medications. Needs labs today. Needs anemia checked Needs RA checked Needs B12.checked Rx for massages Rx for strips. Past Medical History  Diagnosis Date  . Anal stricture   . GERD (gastroesophageal reflux disease)   . Small bowel obstruction   . Hypoglycemia   . Tumor, foot   . Fibromyalgia   . Ulcerative colitis   . Anemia   . Anxiety    Past Surgical History  Procedure Laterality Date  . Total colectomy  12/02/1993  . Proctectomy  12/02/1993  . Knee surgery      lt   History   Social History  . Marital Status: Single    Spouse Name: N/A    Number of Children: 0  . Years of Education: N/A   Occupational History  . RN    Social History Main Topics  . Smoking status: Never Smoker   . Smokeless tobacco: Never Used  . Alcohol Use: Yes     Comment: every 3 to 4 months  . Drug Use: No  . Sexual Activity: Not on file   Other Topics Concern  . Not on file   Social History Narrative   Caffeine use daily         Family History  Problem Relation Age of Onset  . Leukemia      1/2 sister  . Heart disease Father   . Diabetes Maternal Grandmother   . Diabetes Paternal Grandmother   . Diabetes Maternal Uncle   . Hypothyroidism Mother   . Irritable  bowel syndrome Sister   . Colon cancer Neg Hx   . Diverticulitis Paternal Grandfather    Current Outpatient Prescriptions on File Prior to Visit  Medication Sig Dispense Refill  . beclomethasone (QVAR) 80 MCG/ACT inhaler Inhale 1 puff into the lungs 2 (two) times daily.      . cyanocobalamin (,VITAMIN B-12,) 1000 MCG/ML injection Inject 1 mL (1,000 mcg total) into the muscle every 30 (thirty) days.  10 mL  0  . Fexofenadine HCl (ALLEGRA PO) Take 180 mg by mouth daily.       Marland Kitchen lisinopril-hydrochlorothiazide (PRINZIDE,ZESTORETIC) 20-12.5 MG per tablet Take 1 tablet by mouth daily.      . metFORMIN (GLUCOPHAGE) 500 MG tablet Take 1 tablet (500 mg total) by mouth at bedtime.  90 tablet  3  . montelukast (SINGULAIR) 10 MG tablet Take 1 tablet (10 mg total) by mouth at bedtime.  30 tablet  11  . NONFORMULARY OR COMPOUNDED ITEM Immunotherapy 2 injections q week. Followed by [facility]      . pantoprazole (PROTONIX) 40 MG tablet Take 1 tablet (40 mg total) by mouth daily.  30 tablet  5  . amoxicillin-clavulanate (AUGMENTIN) 875-125 MG per tablet Take 1 tablet by mouth 2 (two) times daily.  20 tablet  0  . celecoxib (  CELEBREX) 200 MG capsule Take 1 capsule (200 mg total) by mouth daily.  30 capsule  1  . ondansetron (ZOFRAN-ODT) 8 MG disintegrating tablet Take 1 tablet (8 mg total) by mouth every 8 (eight) hours as needed for nausea.  20 tablet  0  . predniSONE (DELTASONE) 20 MG tablet Take 3 tablets (60 mg total) by mouth daily with breakfast. For 2 days the 2 tabs daily for 2 days then 1 tab daily for 2 days. Then 1/2 tab daily for 2 days  13 tablet  0   No current facility-administered medications on file prior to visit.   Allergies  Allergen Reactions  . Erythromycin   . Mesalamine    Immunization History  Administered Date(s) Administered  . Tdap 07/01/2003   Prior to Admission medications   Medication Sig Start Date End Date Taking? Authorizing Provider  beclomethasone (QVAR) 80  MCG/ACT inhaler Inhale 1 puff into the lungs 2 (two) times daily.   Yes Historical Provider, MD  cyanocobalamin (,VITAMIN B-12,) 1000 MCG/ML injection Inject 1 mL (1,000 mcg total) into the muscle every 30 (thirty) days. 02/06/13  Yes Ileana Ladd, MD  cyclobenzaprine (FLEXERIL) 10 MG tablet Take 1 tablet (10 mg total) by mouth 3 (three) times daily as needed. 12/05/12  Yes Ileana Ladd, MD  diltiazem (CARDIZEM) 30 MG tablet Take 30 mg by mouth 4 (four) times daily.   Yes Historical Provider, MD  Fexofenadine HCl (ALLEGRA PO) Take 180 mg by mouth daily.    Yes Historical Provider, MD  lisinopril-hydrochlorothiazide (PRINZIDE,ZESTORETIC) 20-12.5 MG per tablet Take 1 tablet by mouth daily.   Yes Historical Provider, MD  metFORMIN (GLUCOPHAGE) 500 MG tablet Take 1 tablet (500 mg total) by mouth at bedtime. 12/13/12  Yes Ileana Ladd, MD  montelukast (SINGULAIR) 10 MG tablet Take 1 tablet (10 mg total) by mouth at bedtime. 11/25/12  Yes Deatra Canter, FNP  NONFORMULARY OR COMPOUNDED ITEM Immunotherapy 2 injections q week. [facility]   Yes Historical Provider, MD  pantoprazole (PROTONIX) 40 MG tablet Take 1 tablet (40 mg total) by mouth daily. 09/05/12  Yes Ileana Ladd, MD  amoxicillin-clavulanate (AUGMENTIN) 875-125 MG per tablet Take 1 tablet by mouth 2 (two) times daily. 01/30/13   Ileana Ladd, MD  celecoxib (CELEBREX) 200 MG capsule Take 1 capsule (200 mg total) by mouth daily. 10/24/12   Ileana Ladd, MD  ondansetron (ZOFRAN-ODT) 8 MG disintegrating tablet Take 1 tablet (8 mg total) by mouth every 8 (eight) hours as needed for nausea. 12/15/11   Juliet Rude. Rubin Payor, MD  predniSONE (DELTASONE) 20 MG tablet Take 3 tablets (60 mg total) by mouth daily with breakfast. For 2 days the 2 tabs daily for 2 days then 1 tab daily for 2 days. Then 1/2 tab daily for 2 days 03/21/13   Ileana Ladd, MD     ROS: As above in the HPI. All other systems are stable or  negative.  OBJECTIVE: APPEARANCE:  Patient in no acute distress.The patient appeared well nourished and normally developed. Acyanotic. Waist: VITAL SIGNS:BP 127/83  Pulse 77  Temp(Src) 98.4 F (36.9 C) (Oral)  Ht 5\' 5"  (1.651 m)  Wt 207 lb 3.2 oz (93.985 kg)  BMI 34.48 kg/m2  LMP 04/02/2013 WF Obese  SKIN: warm and  Dry  Rash maculopapular in the left axilla with satellites. Similar under the breasts.   HEAD and Neck: without JVD, Head and scalp: normal Eyes:No scleral icterus. Fundi  normal, eye movements normal. Ears: Auricle normal, canal normal, Tympanic membranes normal, insufflation normal. Nose: normal Throat: normal Neck & thyroid: normal  CHEST & LUNGS: Chest wall: normal Lungs: Clear  CVS: Reveals the PMI to be normally located. Regular rhythm, First and Second Heart sounds are normal,  absence of murmurs, rubs or gallops. Peripheral vasculature: Radial pulses: normal Dorsal pedis pulses: normal Posterior pulses: normal  ABDOMEN:  Appearance: normal Benign, no organomegaly, no masses, no Abdominal Aortic enlargement. No Guarding , no rebound. No Bruits. Bowel sounds: normal  RECTAL: N/A GU: N/A  EXTREMETIES: nonedematous.  MUSCULOSKELETAL:  Spine: normal Joints: intact  NEUROLOGIC: oriented to time,place and person; nonfocal. Strength is normal Sensory is normal Reflexes are normal Cranial Nerves are normal.  ASSESSMENT:  DM (diabetes mellitus) - Plan: POCT glycosylated hemoglobin (Hb A1C), POCT UA - Microalbumin, CMP14+EGFR  GERD (gastroesophageal reflux disease)  Asthma in adult  Anemia - Plan: Anemia Profile B  Tinea corporis - Plan: ketoconazole (NIZORAL) 2 % cream  Muscle spasm - Plan: cyclobenzaprine (FLEXERIL) 10 MG tablet  Screening cholesterol level - Plan: Lipid panel  Generalized muscle ache - Plan: Rheumatoid factor  Other malaise and fatigue - Plan: Thyroid Panel With TSH  PLAN:       Dr Paula Libra  Recommendations  For nutrition information, I recommend books:  1).Eat to Live by Dr Excell Seltzer. 2).Prevent and Reverse Heart Disease by Dr Karl Luke. 3) Dr Janene Harvey Book:  Program to Reverse Diabetes  Exercise recommendations are:  If unable to walk, then the patient can exercise in a chair 3 times a day. By flapping arms like a bird gently and raising legs outwards to the front.  If ambulatory, the patient can go for walks for 30 minutes 3 times a week. Then increase the intensity and duration as tolerated.  Goal is to try to attain exercise frequency to 5 times a week.  If applicable: Best to perform resistance exercises (machines or weights) 2 days a week and cardio type exercises 3 days per week.  Orders Placed This Encounter  Procedures  . Anemia Profile B  . CMP14+EGFR  . Lipid panel  . Rheumatoid factor  . Thyroid Panel With TSH  . POCT glycosylated hemoglobin (Hb A1C)  . POCT UA - Microalbumin  Rx for massages once weekly for her fibromyalgia Rx for her test strips.  Meds ordered this encounter  Medications  . diltiazem (CARDIZEM) 30 MG tablet    Sig: Take 30 mg by mouth 4 (four) times daily.  Marland Kitchen ketoconazole (NIZORAL) 2 % cream    Sig: Apply 1 application topically daily.    Dispense:  60 g    Refill:  0  . fluconazole (DIFLUCAN) 150 MG tablet    Sig: Take 1 tablet (150 mg total) by mouth once.    Dispense:  1 tablet    Refill:  1  . cyclobenzaprine (FLEXERIL) 10 MG tablet    Sig: Take 1 tablet (10 mg total) by mouth 3 (three) times daily as needed.    Dispense:  30 tablet    Refill:  5    Order Specific Question:  Supervising Provider    Answer:  Chipper Herb [[id number]]   Medications Discontinued During This Encounter  Medication Reason  . cyclobenzaprine (FLEXERIL) 10 MG tablet Reorder   Return in about 4 months (around 08/09/2013) for Recheck medical problems.  Gatha Mcnulty P. Jacelyn Grip, M.D.

## 2013-04-11 NOTE — Patient Instructions (Signed)
      Dr Mimie Goering's Recommendations  For nutrition information, I recommend books:  1).Eat to Live by Dr Joel Fuhrman. 2).Prevent and Reverse Heart Disease by Dr Caldwell Esselstyn. 3) Dr Neal Barnard's Book:  Program to Reverse Diabetes  Exercise recommendations are:  If unable to walk, then the patient can exercise in a chair 3 times a day. By flapping arms like a bird gently and raising legs outwards to the front.  If ambulatory, the patient can go for walks for 30 minutes 3 times a week. Then increase the intensity and duration as tolerated.  Goal is to try to attain exercise frequency to 5 times a week.  If applicable: Best to perform resistance exercises (machines or weights) 2 days a week and cardio type exercises 3 days per week.  

## 2013-04-12 LAB — ANEMIA PROFILE B
Basophils Absolute: 0.1 10*3/uL (ref 0.0–0.2)
Basos: 1 %
Eos: 4 %
Eosinophils Absolute: 0.5 10*3/uL — ABNORMAL HIGH (ref 0.0–0.4)
Ferritin: 52 ng/mL (ref 15–150)
Folate: >19.9 ng/mL (ref >3.0)
HCT: 45 % (ref 34.0–46.6)
Hemoglobin: 14.7 g/dL (ref 11.1–15.9)
Immature Grans (Abs): 0 10*3/uL (ref 0.0–0.1)
Immature Granulocytes: 0 %
Iron Saturation: 18 % (ref 15–55)
Iron: 70 ug/dL (ref 35–155)
Lymphocytes Absolute: 2.4 10*3/uL (ref 0.7–3.1)
Lymphs: 23 %
MCH: 29.9 pg (ref 26.6–33.0)
MCHC: 32.7 g/dL (ref 31.5–35.7)
MCV: 92 fL (ref 79–97)
Monocytes Absolute: 0.8 10*3/uL (ref 0.1–0.9)
Monocytes: 8 %
Neutrophils Absolute: 6.6 10*3/uL (ref 1.4–7.0)
Neutrophils Relative %: 64 %
Platelets: 423 10*3/uL — ABNORMAL HIGH (ref 150–379)
RBC: 4.92 x10E6/uL (ref 3.77–5.28)
RDW: 13.5 % (ref 12.3–15.4)
Retic Ct Pct: 1 % (ref 0.6–2.6)
TIBC: 383 ug/dL (ref 250–450)
UIBC: 313 ug/dL (ref 150–375)
Vitamin B-12: 787 pg/mL (ref 211–946)
WBC: 10.4 10*3/uL (ref 3.4–10.8)

## 2013-04-12 LAB — THYROID PANEL WITH TSH
Free Thyroxine Index: 2.4 (ref 1.2–4.9)
T3 Uptake Ratio: 29 % (ref 24–39)
T4, Total: 8.3 ug/dL (ref 4.5–12.0)
TSH: 0.638 u[IU]/mL (ref 0.450–4.500)

## 2013-04-12 LAB — LIPID PANEL
Chol/HDL Ratio: 2.9 ratio units (ref 0.0–4.4)
Cholesterol, Total: 175 mg/dL (ref 100–199)
HDL: 61 mg/dL (ref >39)
LDL Calculated: 96 mg/dL (ref 0–99)
Triglycerides: 90 mg/dL (ref 0–149)
VLDL Cholesterol Cal: 18 mg/dL (ref 5–40)

## 2013-04-12 LAB — CMP14+EGFR
ALT: 30 IU/L (ref 0–32)
AST: 18 IU/L (ref 0–40)
Albumin/Globulin Ratio: 1.7 (ref 1.1–2.5)
Albumin: 4.3 g/dL (ref 3.5–5.5)
Alkaline Phosphatase: 50 IU/L (ref 39–117)
BUN/Creatinine Ratio: 20 (ref 9–23)
BUN: 16 mg/dL (ref 6–24)
CO2: 24 mmol/L (ref 18–29)
Calcium: 9.7 mg/dL (ref 8.7–10.2)
Chloride: 99 mmol/L (ref 97–108)
Creatinine, Ser: 0.8 mg/dL (ref 0.57–1.00)
GFR calc Af Amer: 101 mL/min/{1.73_m2} (ref >59)
GFR calc non Af Amer: 87 mL/min/{1.73_m2} (ref >59)
Globulin, Total: 2.6 g/dL (ref 1.5–4.5)
Glucose: 118 mg/dL — ABNORMAL HIGH (ref 65–99)
Potassium: 4.7 mmol/L (ref 3.5–5.2)
Sodium: 138 mmol/L (ref 134–144)
Total Bilirubin: 0.5 mg/dL (ref 0.0–1.2)
Total Protein: 6.9 g/dL (ref 6.0–8.5)

## 2013-04-12 LAB — RHEUMATOID FACTOR: Rhuematoid fact SerPl-aCnc: 8.5 IU/mL (ref 0.0–13.9)

## 2013-05-02 ENCOUNTER — Other Ambulatory Visit: Payer: Self-pay | Admitting: *Deleted

## 2013-05-02 ENCOUNTER — Other Ambulatory Visit: Payer: Self-pay | Admitting: Family Medicine

## 2013-05-02 ENCOUNTER — Telehealth: Payer: Self-pay | Admitting: Family Medicine

## 2013-05-02 MED ORDER — LISINOPRIL-HYDROCHLOROTHIAZIDE 20-12.5 MG PO TABS: 1.0000 | ORAL_TABLET | Freq: Every day | ORAL | Status: DC

## 2013-05-02 NOTE — Telephone Encounter (Signed)
Rx ready for nurse to Phone in. 

## 2013-05-03 NOTE — Telephone Encounter (Signed)
Left refill authorization on voicemail at [facility]

## 2013-06-19 ENCOUNTER — Telehealth: Payer: Self-pay | Admitting: Family Medicine

## 2013-06-23 ENCOUNTER — Other Ambulatory Visit: Payer: Self-pay | Admitting: Family Medicine

## 2013-06-23 MED ORDER — BECLOMETHASONE DIPROPIONATE 80 MCG/ACT IN AERS: 1.0000 | INHALATION_SPRAY | Freq: Two times a day (BID) | RESPIRATORY_TRACT | Status: AC

## 2013-06-23 NOTE — Telephone Encounter (Signed)
Call patient : Prescription refilled & sent to pharmacy in [facility]. 

## 2013-07-27 ENCOUNTER — Other Ambulatory Visit: Payer: Self-pay | Admitting: *Deleted

## 2013-07-27 MED ORDER — FLUCONAZOLE 150 MG PO TABS: 150.0000 mg | ORAL_TABLET | Freq: Once | ORAL | Status: DC

## 2013-08-25 ENCOUNTER — Telehealth: Payer: Self-pay | Admitting: *Deleted

## 2013-08-25 ENCOUNTER — Other Ambulatory Visit: Payer: Self-pay | Admitting: Family

## 2013-08-25 MED ORDER — FLUCONAZOLE 150 MG PO TABS: 150.0000 mg | ORAL_TABLET | Freq: Once | ORAL | Status: DC

## 2013-08-25 NOTE — Telephone Encounter (Signed)
Per Suanne Marker, pt needs 2 Diflucan called into [facility] on [address]?

## 2013-09-05 ENCOUNTER — Ambulatory Visit (INDEPENDENT_AMBULATORY_CARE_PROVIDER_SITE_OTHER): Payer: PRIVATE HEALTH INSURANCE | Admitting: Family Medicine

## 2013-09-05 ENCOUNTER — Encounter: Payer: Self-pay | Admitting: Family Medicine

## 2013-09-05 VITALS — BP 131/83 | HR 108 | Temp 98.1°F | Ht 65.5 in | Wt 212.0 lb

## 2013-09-05 DIAGNOSIS — Z789 Other specified health status: Secondary | ICD-10-CM

## 2013-09-05 MED ORDER — FLUCONAZOLE 150 MG PO TABS: 150.0000 mg | ORAL_TABLET | Freq: Once | ORAL | Status: DC

## 2013-09-05 MED ORDER — ALBUTEROL SULFATE HFA 108 (90 BASE) MCG/ACT IN AERS: 2.0000 | INHALATION_SPRAY | Freq: Four times a day (QID) | RESPIRATORY_TRACT | Status: AC | PRN

## 2013-09-05 MED ORDER — ONDANSETRON 8 MG PO TBDP: 8.0000 mg | ORAL_TABLET | Freq: Three times a day (TID) | ORAL | Status: DC | PRN

## 2013-09-05 MED ORDER — CIPROFLOXACIN HCL 500 MG PO TABS: 500.0000 mg | ORAL_TABLET | Freq: Two times a day (BID) | ORAL | Status: DC

## 2013-09-05 NOTE — Progress Notes (Signed)
   Subjective:    Patient ID: AKILAH CURETON, female    DOB: Apr 02, 1965, 48 y.o.   MRN: [id number]  HPI  This 48 y.o. female presents for evaluation of Travel to [place] for [place] work.  She wants rx for Cipro, zofran, and albuterol MDI.  She has had immunizations already for trip.  Review of Systems    No chest pain SOB, HA, dizziness, vision change, N/V, diarrhea, constipation, dysuria, urinary urgency or frequency, myalgias, arthralgias or rash.  Objective:   Physical Exam  Vital signs noted  Well developed well nourished female.  HEENT - Head atraumatic Normocephalic                Eyes - PERRLA, Conjuctiva - clear Sclera- Clear EOMI                Ears - EAC's Wnl TM's Wnl Gross Hearing WNL                Nose - Nares patent                 Throat - oropharanx wnl Respiratory - Lungs CTA bilateral Cardiac - RRR S1 and S2 without murmur GI - Abdomen soft Nontender and bowel sounds active x 4 Extremities - No edema. Neuro - Grossly intact.      Assessment & Plan:  Engages in travel abroad RX for cipro 500mg  one po bid x 10 days for travelers diarrhea given.  She has had malaria prophylaxis at [facility].  She has UTD immunizations for her trip to [place].  She is given Refill on zofran and proventil MDI.  Lysbeth Penner FNP

## 2013-10-09 ENCOUNTER — Encounter: Payer: Self-pay | Admitting: Internal Medicine

## 2013-11-02 ENCOUNTER — Encounter (HOSPITAL_COMMUNITY): Payer: Self-pay | Admitting: Emergency Medicine

## 2013-11-02 ENCOUNTER — Emergency Department (HOSPITAL_COMMUNITY)
Admission: EM | Admit: 2013-11-02 | Discharge: 2013-11-02 | Disposition: A | Discharge status: Home / Self Care | Payer: PRIVATE HEALTH INSURANCE | Attending: Emergency Medicine | Admitting: Emergency Medicine

## 2013-11-02 ENCOUNTER — Emergency Department (HOSPITAL_COMMUNITY): Payer: PRIVATE HEALTH INSURANCE

## 2013-11-02 DIAGNOSIS — Z872 Personal history of diseases of the skin and subcutaneous tissue: Secondary | ICD-10-CM | POA: Diagnosis not present

## 2013-11-02 DIAGNOSIS — R42 Dizziness and giddiness: Secondary | ICD-10-CM

## 2013-11-02 DIAGNOSIS — IMO0002 Reserved for concepts with insufficient information to code with codable children: Secondary | ICD-10-CM | POA: Insufficient documentation

## 2013-11-02 DIAGNOSIS — Z8639 Personal history of other endocrine, nutritional and metabolic disease: Secondary | ICD-10-CM | POA: Insufficient documentation

## 2013-11-02 DIAGNOSIS — Z862 Personal history of diseases of the blood and blood-forming organs and certain disorders involving the immune mechanism: Secondary | ICD-10-CM | POA: Insufficient documentation

## 2013-11-02 DIAGNOSIS — Z79899 Other long term (current) drug therapy: Secondary | ICD-10-CM | POA: Insufficient documentation

## 2013-11-02 DIAGNOSIS — K219 Gastro-esophageal reflux disease without esophagitis: Secondary | ICD-10-CM | POA: Diagnosis not present

## 2013-11-02 DIAGNOSIS — Z8659 Personal history of other mental and behavioral disorders: Secondary | ICD-10-CM | POA: Diagnosis not present

## 2013-11-02 DIAGNOSIS — R112 Nausea with vomiting, unspecified: Secondary | ICD-10-CM | POA: Insufficient documentation

## 2013-11-02 DIAGNOSIS — Z3202 Encounter for pregnancy test, result negative: Secondary | ICD-10-CM | POA: Diagnosis not present

## 2013-11-02 LAB — BASIC METABOLIC PANEL
Anion gap: 13 (ref 5–15)
BUN: 20 mg/dL (ref 6–23)
CO2: 24 meq/L (ref 19–32)
CREATININE: 0.73 mg/dL (ref 0.50–1.10)
Calcium: 9.3 mg/dL (ref 8.4–10.5)
Chloride: 101 mEq/L (ref 96–112)
GFR calc non Af Amer: >90 mL/min (ref >90)
Glucose, Bld: 121 mg/dL — ABNORMAL HIGH (ref 70–99)
Potassium: 4 mEq/L (ref 3.7–5.3)
Sodium: 138 mEq/L (ref 137–147)

## 2013-11-02 LAB — CBC WITH DIFFERENTIAL/PLATELET
BASOS PCT: 0 % (ref 0–1)
Basophils Absolute: 0 10*3/uL (ref 0.0–0.1)
Eosinophils Absolute: 0.3 10*3/uL (ref 0.0–0.7)
Eosinophils Relative: 2 % (ref 0–5)
HEMATOCRIT: 41.2 % (ref 36.0–46.0)
Hemoglobin: 14 g/dL (ref 12.0–15.0)
Lymphocytes Relative: 15 % (ref 12–46)
Lymphs Abs: 1.9 10*3/uL (ref 0.7–4.0)
MCH: 29.9 pg (ref 26.0–34.0)
MCHC: 34 g/dL (ref 30.0–36.0)
MCV: 87.8 fL (ref 78.0–100.0)
MONO ABS: 0.6 10*3/uL (ref 0.1–1.0)
MONOS PCT: 5 % (ref 3–12)
Neutro Abs: 9.8 10*3/uL — ABNORMAL HIGH (ref 1.7–7.7)
Neutrophils Relative %: 78 % — ABNORMAL HIGH (ref 43–77)
Platelets: 367 10*3/uL (ref 150–400)
RBC: 4.69 MIL/uL (ref 3.87–5.11)
RDW: 13.6 % (ref 11.5–15.5)
WBC: 12.7 10*3/uL — ABNORMAL HIGH (ref 4.0–10.5)

## 2013-11-02 LAB — PREGNANCY, URINE: PREG TEST UR: NEGATIVE

## 2013-11-02 LAB — URINALYSIS, ROUTINE W REFLEX MICROSCOPIC
Bilirubin Urine: NEGATIVE
GLUCOSE, UA: NEGATIVE mg/dL
Hgb urine dipstick: NEGATIVE
Ketones, ur: NEGATIVE mg/dL
LEUKOCYTES UA: NEGATIVE
Nitrite: NEGATIVE
PH: 5.5 (ref 5.0–8.0)
PROTEIN: NEGATIVE mg/dL
Specific Gravity, Urine: 1.022 (ref 1.005–1.030)
Urobilinogen, UA: 0.2 mg/dL (ref 0.0–1.0)

## 2013-11-02 LAB — I-STAT TROPONIN, ED: Troponin i, poc: 0 ng/mL (ref 0.00–0.08)

## 2013-11-02 MED ORDER — ONDANSETRON HCL 4 MG/2ML IJ SOLN
4.0000 mg | Freq: Once | INTRAMUSCULAR | Status: AC
  Administered 2013-11-02: 4 mg via INTRAVENOUS
  Filled 2013-11-02: qty 2

## 2013-11-02 MED ORDER — MECLIZINE HCL 25 MG PO TABS
50.0000 mg | ORAL_TABLET | Freq: Once | ORAL | Status: AC
  Administered 2013-11-02: 50 mg via ORAL
  Filled 2013-11-02: qty 2

## 2013-11-02 MED ORDER — MECLIZINE HCL 25 MG PO TABS: 25.0000 mg | ORAL_TABLET | Freq: Once | ORAL | Status: DC

## 2013-11-02 MED ORDER — SODIUM CHLORIDE 0.9 % IV BOLUS (SEPSIS)
1000.0000 mL | Freq: Once | INTRAVENOUS | Status: AC
  Administered 2013-11-02: 1000 mL via INTRAVENOUS

## 2013-11-02 MED ORDER — MECLIZINE HCL 25 MG PO TABS: 25.0000 mg | ORAL_TABLET | Freq: Three times a day (TID) | ORAL | Status: DC | PRN

## 2013-11-02 MED ORDER — ONDANSETRON HCL 4 MG PO TABS: 4.0000 mg | ORAL_TABLET | Freq: Four times a day (QID) | ORAL | Status: DC

## 2013-11-02 NOTE — ED Notes (Addendum)
Pt presents with dizziness that started yesterday.  Pt reports waking up yesterday morning with double vision and feeling like the room is spinning- symptoms resolved after an hour.  When pt woke up this morning she felt dizzy and nauseated- reports vomiting x 2 this morning.  Pt alert and oriented X 4 at present- neuro exam negative.  Pt reports when she moves her head the dizziness is worse.  Pt reports returning from [place] on July 31st.

## 2013-11-02 NOTE — ED Provider Notes (Signed)
Medical screening examination/treatment/procedure(s) were performed by non-physician practitioner and as supervising physician I was immediately available for consultation/collaboration.   EKG Interpretation   Date/Time:  Thursday November 02 2013 09:17:15 EDT Ventricular Rate:  78 PR Interval:  151 QRS Duration: 80 QT Interval:  383 QTC Calculation: 436 R Axis:   -7 Text Interpretation:  Sinus rhythm Low voltage, precordial leads No old  tracing to compare Confirmed by Jair Lindblad  MD-J, Alecxander Mainwaring ([id number]) on 11/02/2013  9:33:15 AM      Dorie Rank, MD 11/02/13 [phone]

## 2013-11-02 NOTE — Discharge Instructions (Signed)
Take the prescribed medication as directed. °Follow-up with your primary care physician. °Return to the ED for new or worsening symptoms. ° °

## 2013-11-02 NOTE — ED Notes (Signed)
Lab called about delay in urine preg.

## 2013-11-02 NOTE — ED Provider Notes (Signed)
CSN: [id number]     Arrival date & time 11/02/13  0907 History   First MD Initiated Contact with Patient 11/02/13 [phone]     Chief Complaint  Patient presents with  . Dizziness     (Consider location/radiation/quality/duration/timing/severity/associated sxs/prior Treatment) The history is provided by the patient and medical records.   This is a 48 y.o. F with PMH significant for anal strictures, GERD, SBO, hypoglycemia, fibromyalgia, anemia, anxiety, presenting to the ED for dizziness.  Patient states yesterday she woke up, got out of bed and felt dizzy with blurred vision which she thought was because she tried to stand up too fast.  States she laid back down for about another hour and felt better, was able to clean her entire house without difficulties or recurrent sx.  States again upon waking this morning she felt dizzy again, states attempted to walk down the hall and after turning a corner to the bathroom she immediately vomited twice.  She denies any abdominal pain, fever, diarrhea, or recent sick contacts.  States does not feel like a GI bug, but rather sx from her dizziness.  Dizziness seems worse with movement, especially of her head.  Patient is a nurse on the neuro ICU floor, states she is concerned over worse case scenario-- ie brain tumor, etc.  She did travel to [place] in July, returned on the 31st and expresses concern for possible amoeba, etc.  Denies any chest pain, SOB, headache, tinnitus, changes in speech, ataxia, unilateral weakness, numbness, or paresthesias.    Past Medical History  Diagnosis Date  . Anal stricture   . GERD (gastroesophageal reflux disease)   . Small bowel obstruction   . Hypoglycemia   . Tumor, foot   . Fibromyalgia   . Ulcerative colitis   . Anemia   . Anxiety    Past Surgical History  Procedure Laterality Date  . Total colectomy  12/02/1993  . Proctectomy  12/02/1993  . Knee surgery      lt   Family History  Problem Relation Age of Onset  .  Leukemia      1/2 sister  . Heart disease Father   . Diabetes Maternal Grandmother   . Diabetes Paternal Grandmother   . Diabetes Maternal Uncle   . Hypothyroidism Mother   . Irritable bowel syndrome Sister   . Colon cancer Neg Hx   . Diverticulitis Paternal Grandfather    History  Substance Use Topics  . Smoking status: Never Smoker   . Smokeless tobacco: Never Used  . Alcohol Use: Yes     Comment: every 3 to 4 months   OB History   Grav Para Term Preterm Abortions TAB SAB Ect Mult Living                 Review of Systems  Gastrointestinal: Positive for nausea and vomiting.  Neurological: Positive for dizziness.  All other systems reviewed and are negative.     Allergies  Erythromycin and Mesalamine  Home Medications   Prior to Admission medications   Medication Sig Start Date End Date Taking? Authorizing Provider  beclomethasone (QVAR) 80 MCG/ACT inhaler Inhale 1 puff into the lungs 2 (two) times daily. 06/23/13  Yes Vernie Shanks, MD  cyanocobalamin (,VITAMIN B-12,) 1000 MCG/ML injection Inject 1 mL (1,000 mcg total) into the muscle every 30 (thirty) days. 02/06/13  Yes Vernie Shanks, MD  cyclobenzaprine (FLEXERIL) 10 MG tablet Take 1 tablet (10 mg total) by mouth 3 (three) times daily  as needed. 04/11/13  Yes Vernie Shanks, MD  diltiazem (CARDIZEM) 30 MG tablet Take 30 mg by mouth 4 (four) times daily.   Yes Historical Provider, MD  Fexofenadine HCl (ALLEGRA PO) Take 180 mg by mouth daily.    Yes Historical Provider, MD  lisinopril-hydrochlorothiazide (PRINZIDE,ZESTORETIC) 20-12.5 MG per tablet Take 1 tablet by mouth daily. 05/02/13  Yes Vernie Shanks, MD  metFORMIN (GLUCOPHAGE) 500 MG tablet Take 1 tablet (500 mg total) by mouth at bedtime. 12/13/12  Yes Vernie Shanks, MD  montelukast (SINGULAIR) 10 MG tablet Take 1 tablet (10 mg total) by mouth at bedtime. 11/25/12  Yes Lysbeth Penner, FNP  ondansetron (ZOFRAN-ODT) 8 MG disintegrating tablet Take 1 tablet (8 mg  total) by mouth every 8 (eight) hours as needed for nausea. 09/05/13  Yes Lysbeth Penner, FNP  pantoprazole (PROTONIX) 40 MG tablet Take 1 tablet (40 mg total) by mouth daily. 09/05/12  Yes Vernie Shanks, MD  albuterol (PROVENTIL HFA;VENTOLIN HFA) 108 (90 BASE) MCG/ACT inhaler Inhale 2 puffs into the lungs every 6 (six) hours as needed for wheezing or shortness of breath. 09/05/13   Lysbeth Penner, FNP   BP 120/71  Temp(Src) 98.4 F (36.9 C) (Oral)  Resp 17  Ht 5\' 5"  (1.651 m)  Wt 211 lb (95.709 kg)  BMI 35.11 kg/m2  SpO2 99%  LMP 10/19/2013  Physical Exam  Nursing note and vitals reviewed. Constitutional: She is oriented to person, place, and time. She appears well-developed and well-nourished. No distress.  HENT:  Head: Normocephalic and atraumatic.  Mouth/Throat: Oropharynx is clear and moist.  Eyes: Conjunctivae and EOM are normal. Pupils are equal, round, and reactive to light.  Neck: Normal range of motion.  Cardiovascular: Normal rate, regular rhythm and normal heart sounds.   Pulmonary/Chest: Effort normal and breath sounds normal. No respiratory distress. She has no wheezes.  Abdominal: Soft. Bowel sounds are normal. There is no tenderness. There is no guarding.  Musculoskeletal: Normal range of motion.  Neurological: She is alert and oriented to person, place, and time.  AAOx3, answering questions and following commands appropriately; equal strength UE and LE bilaterally; CN grossly intact; moves all extremities appropriately without ataxia; no dysmetria with finger to nose bilaterally; no pronator drift; normal coordination; no focal neuro deficits or facial asymmetry appreciated  Skin: Skin is warm and dry. She is not diaphoretic.  Psychiatric: She has a normal mood and affect.    ED Course  Procedures (including critical care time) Labs Review Labs Reviewed  CBC WITH DIFFERENTIAL - Abnormal; Notable for the following:    WBC 12.7 (*)    Neutrophils Relative % 78 (*)     Neutro Abs 9.8 (*)    All other components within normal limits  BASIC METABOLIC PANEL - Abnormal; Notable for the following:    Glucose, Bld 121 (*)    All other components within normal limits  URINALYSIS, ROUTINE W REFLEX MICROSCOPIC - Abnormal; Notable for the following:    APPearance HAZY (*)    All other components within normal limits  PREGNANCY, URINE  I-STAT TROPOININ, ED    Imaging Review Ct Head Wo Contrast  11/02/2013   CLINICAL DATA:  Dizziness  EXAM: CT HEAD WITHOUT CONTRAST  TECHNIQUE: Contiguous axial images were obtained from the base of the skull through the vertex without intravenous contrast.  COMPARISON:  None.  FINDINGS: Ventricle size normal. Negative for acute or chronic ischemia. Negative for hemorrhage or mass.  Chronic  sinusitis.  No acute skull abnormality.  IMPRESSION: Negative CT head  Chronic sinusitis.   Electronically Signed   By: Franchot Gallo M.D.   On: 11/02/2013 11:09     EKG Interpretation   Date/Time:  Thursday November 02 2013 09:17:15 EDT Ventricular Rate:  78 PR Interval:  151 QRS Duration: 80 QT Interval:  383 QTC Calculation: 436 R Axis:   -7 Text Interpretation:  Sinus rhythm Low voltage, precordial leads No old  tracing to compare Confirmed by KNAPP  MD-J, JON ([id number]) on 11/02/2013  9:33:15 AM      MDM   Final diagnoses:  Dizziness   48 y.o. F with intermittent dizziness over the past 2 days, worse with movement, associated with nausea and vomiting.  On exam patient is neurologically intact without focal deficits.  Sx seem vertiginous in nature.  Will obtain EKG, basic labs, orthostatic VS.  She does express concern for possible brain tumor and would feel more comfortable obtaining CT scan which is not unreasonable.  Fluids, zofran, meclizine given.  EKG NSR without ischemic changes.  Lab work reassuring.  CT head negative for acute findings.  Orthostatic VS appropriate.  After fluids, zofran, and meclizine sx have significantly  improved.  Patient has gotten out of bed to ambulate to restroom with steady gait, no ataxia noted.  Neuro exam remains non-focal.  Tolerating PO without recurrent nausea or vomiting.  Sx most likely vertiginous in nature.  Will start on meclizine and zofran for home.  Close PCP FU within 1 week.  Discussed plan with patient, he/she acknowledged understanding and agreed with plan of care.  Return precautions given for new or worsening symptoms.  Larene Pickett, PA-C 11/02/13 1510

## 2013-11-03 ENCOUNTER — Encounter: Payer: Self-pay | Admitting: Family Medicine

## 2013-11-03 ENCOUNTER — Ambulatory Visit (INDEPENDENT_AMBULATORY_CARE_PROVIDER_SITE_OTHER): Payer: PRIVATE HEALTH INSURANCE | Admitting: Family Medicine

## 2013-11-03 VITALS — BP 116/76 | HR 92 | Temp 98.4°F | Ht 65.0 in | Wt 209.0 lb

## 2013-11-03 DIAGNOSIS — J321 Chronic frontal sinusitis: Secondary | ICD-10-CM

## 2013-11-03 MED ORDER — METHYLPREDNISOLONE (PAK) 4 MG PO TABS: ORAL_TABLET | ORAL | Status: DC

## 2013-11-03 MED ORDER — AMOXICILLIN-POT CLAVULANATE 875-125 MG PO TABS: 1.0000 | ORAL_TABLET | Freq: Two times a day (BID) | ORAL | Status: DC

## 2013-11-03 MED ORDER — FLUCONAZOLE 150 MG PO TABS: 150.0000 mg | ORAL_TABLET | Freq: Once | ORAL | Status: DC

## 2013-11-03 NOTE — Progress Notes (Signed)
   Subjective:    Patient ID: Gwendolyn Howard, female    DOB: 05-01-1965, 48 y.o.   MRN: [id number]  HPI This 48 y.o. female presents for evaluation of vertigo.  She went to the ED and she had CT Scan which was normal.   Review of Systems No chest pain, SOB, HA, dizziness, vision change, N/V, diarrhea, constipation, dysuria, urinary urgency or frequency, myalgias, arthralgias or rash.     Objective:   Physical Exam Vital signs noted  Well developed well nourished female.  HEENT - Head atraumatic Normocephalic                Eyes - PERRLA, Conjuctiva - clear Sclera- Clear EOMI                Ears - EAC's Wnl TM's Wnl Gross Hearing WNL                Throat - oropharanx wnl Respiratory - Lungs CTA bilateral Cardiac - RRR S1 and S2 without murmur GI - Abdomen soft Nontender and bowel sounds active x 4 Extremities - No edema. Neuro - Grossly intact.       Assessment & Plan:  Sinusitis chronic, frontal - Plan: amoxicillin-clavulanate (AUGMENTIN) 875-125 MG per tablet, methylPREDNIsolone (MEDROL DOSPACK) 4 MG tablet, fluconazole (DIFLUCAN) 150 MG tablet  Gwendolyn Penner FNP

## 2014-02-01 ENCOUNTER — Other Ambulatory Visit: Payer: Self-pay | Admitting: *Deleted

## 2014-02-01 MED ORDER — CYANOCOBALAMIN 1000 MCG/ML IJ SOLN: 1000.0000 ug | INTRAMUSCULAR | Status: AC

## 2014-02-19 ENCOUNTER — Other Ambulatory Visit: Payer: Self-pay | Admitting: *Deleted

## 2014-02-19 MED ORDER — METFORMIN HCL 500 MG PO TABS: 500.0000 mg | ORAL_TABLET | Freq: Every day | ORAL | Status: DC

## 2014-02-19 NOTE — Telephone Encounter (Signed)
No A1C since 04/2013. Bills patient

## 2014-02-19 NOTE — Telephone Encounter (Signed)
You can okay metformin 1 month but patient will have to be seen because this is not appropriate to okay this without checking her kidney function and an A1c----please make an appointment for her to see Dr. Livia Snellen

## 2014-02-19 NOTE — Telephone Encounter (Signed)
Please okay this prescription for 3 months and insists that the patient be seen before the prescription runs out

## 2014-02-28 ENCOUNTER — Other Ambulatory Visit: Payer: Self-pay | Admitting: Family Medicine

## 2014-02-28 ENCOUNTER — Telehealth: Payer: Self-pay | Admitting: *Deleted

## 2014-02-28 NOTE — Telephone Encounter (Signed)
Rhonda's sister needs syringes and needles for her B12 injections monthly. Not sure how to order these?

## 2014-02-28 NOTE — Telephone Encounter (Signed)
Zigmund Daniel can you order this for me thanks

## 2014-03-06 MED ORDER — "NEEDLE (DISP) 25G X 1"" MISC": 1.0000 | Freq: Every day | Status: DC

## 2014-03-06 MED ORDER — SYRINGE (DISPOSABLE) 3 ML MISC: 1.0000 mL | Freq: Every day | Status: DC

## 2014-03-06 NOTE — Addendum Note (Signed)
Addended by: Marin Olp on: 03/06/2014 10:27 AM   Modules accepted: Orders

## 2014-03-06 NOTE — Telephone Encounter (Signed)
Pt needed Rx for needles and syringes for B12 inj Approved per Dietrich Pates

## 2014-03-13 ENCOUNTER — Other Ambulatory Visit: Payer: Self-pay | Admitting: Obstetrics and Gynecology

## 2014-03-14 ENCOUNTER — Other Ambulatory Visit: Payer: Self-pay

## 2014-03-14 ENCOUNTER — Other Ambulatory Visit: Payer: Self-pay | Admitting: Obstetrics and Gynecology

## 2014-03-14 DIAGNOSIS — N63 Unspecified lump in unspecified breast: Secondary | ICD-10-CM

## 2014-03-14 LAB — CYTOLOGY - PAP

## 2014-03-15 ENCOUNTER — Telehealth: Payer: Self-pay | Admitting: *Deleted

## 2014-03-15 MED ORDER — PANTOPRAZOLE SODIUM 40 MG PO TBEC: 40.0000 mg | DELAYED_RELEASE_TABLET | Freq: Every day | ORAL | Status: DC

## 2014-03-15 NOTE — Telephone Encounter (Signed)
done

## 2014-03-22 ENCOUNTER — Other Ambulatory Visit: Payer: PRIVATE HEALTH INSURANCE

## 2014-03-27 ENCOUNTER — Ambulatory Visit
Admission: RE | Admit: 2014-03-27 | Discharge: 2014-03-27 | Disposition: A | Discharge status: Home / Self Care | Payer: PRIVATE HEALTH INSURANCE | Source: Ambulatory Visit | Attending: Obstetrics and Gynecology | Admitting: Obstetrics and Gynecology

## 2014-03-27 DIAGNOSIS — N63 Unspecified lump in unspecified breast: Secondary | ICD-10-CM

## 2014-04-04 ENCOUNTER — Other Ambulatory Visit: Payer: Self-pay | Admitting: *Deleted

## 2014-04-04 MED ORDER — FLUCONAZOLE 150 MG PO TABS: 150.0000 mg | ORAL_TABLET | Freq: Once | ORAL | Status: DC

## 2014-04-09 ENCOUNTER — Encounter: Payer: Self-pay | Admitting: Family Medicine

## 2014-04-09 ENCOUNTER — Ambulatory Visit (INDEPENDENT_AMBULATORY_CARE_PROVIDER_SITE_OTHER): Payer: PRIVATE HEALTH INSURANCE | Admitting: Family Medicine

## 2014-04-09 VITALS — BP 132/83 | HR 102 | Temp 97.3°F | Ht 65.0 in | Wt 217.0 lb

## 2014-04-09 DIAGNOSIS — J206 Acute bronchitis due to rhinovirus: Secondary | ICD-10-CM

## 2014-04-09 DIAGNOSIS — R05 Cough: Secondary | ICD-10-CM

## 2014-04-09 DIAGNOSIS — R059 Cough, unspecified: Secondary | ICD-10-CM

## 2014-04-09 MED ORDER — LEVALBUTEROL HCL 1.25 MG/3ML IN NEBU: 1.2500 mg | INHALATION_SOLUTION | RESPIRATORY_TRACT | Status: DC | PRN

## 2014-04-09 MED ORDER — CEFTRIAXONE SODIUM 1 G IJ SOLR
1.0000 g | INTRAMUSCULAR | Status: AC
  Administered 2014-04-09: 1 g via INTRAMUSCULAR

## 2014-04-09 MED ORDER — METHYLPREDNISOLONE ACETATE 80 MG/ML IJ SUSP
80.0000 mg | Freq: Once | INTRAMUSCULAR | Status: AC
  Administered 2014-04-09: 80 mg via INTRAMUSCULAR

## 2014-04-09 MED ORDER — LEVOFLOXACIN 500 MG PO TABS: 500.0000 mg | ORAL_TABLET | Freq: Every day | ORAL | Status: DC

## 2014-04-09 MED ORDER — HYDROCODONE-HOMATROPINE 5-1.5 MG/5ML PO SYRP: 5.0000 mL | ORAL_SOLUTION | Freq: Three times a day (TID) | ORAL | Status: DC | PRN

## 2014-04-09 MED ORDER — LEVALBUTEROL HCL 1.25 MG/0.5ML IN NEBU
1.2500 mg | INHALATION_SOLUTION | Freq: Once | RESPIRATORY_TRACT | Status: AC
  Administered 2014-04-09: 1.25 mg via RESPIRATORY_TRACT

## 2014-04-09 MED ORDER — PREDNISONE 10 MG PO TABS: ORAL_TABLET | ORAL | Status: DC

## 2014-04-09 NOTE — Progress Notes (Signed)
   Subjective:    Patient ID: Gwendolyn Howard, female    DOB: [birth year], 49 y.o.   MRN: [id number]  HPI C/o cough and uri sx's.  She has sinus infection which has moved into her chest and she cannot quit coughing.  Review of Systems  Constitutional: Negative for fever.  HENT: Negative for ear pain.   Eyes: Negative for discharge.  Respiratory: Negative for cough.   Cardiovascular: Negative for chest pain.  Gastrointestinal: Negative for abdominal distention.  Endocrine: Negative for polyuria.  Genitourinary: Negative for difficulty urinating.  Musculoskeletal: Negative for gait problem and neck pain.  Skin: Negative for color change and rash.  Neurological: Negative for speech difficulty and headaches.  Psychiatric/Behavioral: Negative for agitation.       Objective:    BP 132/83 mmHg  Pulse 102  Temp(Src) 97.3 F (36.3 C) (Oral)  Ht 5\' 5"  (1.651 m)  Wt 217 lb (98.431 kg)  BMI 36.11 kg/m2  LMP 03/19/2014 (Approximate) Physical Exam  Constitutional: She is oriented to person, place, and time. She appears well-developed and well-nourished.  HENT:  Head: Normocephalic and atraumatic.  Mouth/Throat: Oropharynx is clear and moist.  Eyes: Pupils are equal, round, and reactive to light.  Neck: Normal range of motion. Neck supple.  Cardiovascular: Normal rate and regular rhythm.   No murmur heard. Pulmonary/Chest: Effort normal and breath sounds normal.  Abdominal: Soft. Bowel sounds are normal. There is no tenderness.  Neurological: She is alert and oriented to person, place, and time.  Skin: Skin is warm and dry.  Psychiatric: She has a normal mood and affect.          Assessment & Plan:     ICD-9-CM ICD-10-CM   1. Cough 786.2 R05 DG Chest 2 View     levalbuterol (XOPENEX) nebulizer solution 1.25 mg     methylPREDNISolone acetate (DEPO-MEDROL) injection 80 mg     levalbuterol (XOPENEX) 1.25 MG/3ML nebulizer solution     cefTRIAXone (ROCEPHIN) injection 1 g   levofloxacin (LEVAQUIN) 500 MG tablet     predniSONE (DELTASONE) 10 MG tablet     HYDROcodone-homatropine (HYCODAN) 5-1.5 MG/5ML syrup  2. Acute bronchitis due to Rhinovirus 466.0 J20.6 levalbuterol (XOPENEX) 1.25 MG/3ML nebulizer solution   079.3  cefTRIAXone (ROCEPHIN) injection 1 g     levofloxacin (LEVAQUIN) 500 MG tablet     predniSONE (DELTASONE) 10 MG tablet     HYDROcodone-homatropine (HYCODAN) 5-1.5 MG/5ML syrup     No Follow-up on file.  Lysbeth Penner FNP

## 2014-04-10 MED ORDER — GLUCOSE BLOOD VI STRP: ORAL_STRIP | Status: DC

## 2014-04-10 NOTE — Addendum Note (Signed)
Addended by: Jamelle Haring on: 04/10/2014 12:08 PM   Modules accepted: Orders

## 2014-04-12 ENCOUNTER — Other Ambulatory Visit: Payer: Self-pay | Admitting: *Deleted

## 2014-04-12 ENCOUNTER — Telehealth: Payer: Self-pay | Admitting: Family Medicine

## 2014-04-12 MED ORDER — POCKET SPACER DEVI: Status: DC

## 2014-04-13 ENCOUNTER — Encounter: Payer: PRIVATE HEALTH INSURANCE | Admitting: Family Medicine

## 2014-04-13 ENCOUNTER — Ambulatory Visit (INDEPENDENT_AMBULATORY_CARE_PROVIDER_SITE_OTHER): Payer: PRIVATE HEALTH INSURANCE | Admitting: Family Medicine

## 2014-04-13 ENCOUNTER — Encounter: Payer: Self-pay | Admitting: Family Medicine

## 2014-04-13 ENCOUNTER — Ambulatory Visit (INDEPENDENT_AMBULATORY_CARE_PROVIDER_SITE_OTHER): Payer: PRIVATE HEALTH INSURANCE

## 2014-04-13 VITALS — BP 138/83 | HR 95 | Temp 97.8°F | Ht 65.0 in | Wt 217.0 lb

## 2014-04-13 DIAGNOSIS — J208 Acute bronchitis due to other specified organisms: Secondary | ICD-10-CM

## 2014-04-13 LAB — POCT CBC
Granulocyte percent: 84.4 %G — AB (ref 37–80)
HCT, POC: 45.2 % (ref 37.7–47.9)
Hemoglobin: 14.1 g/dL (ref 12.2–16.2)
Lymph, poc: 2 (ref 0.6–3.4)
MCH, POC: 27.8 pg (ref 27–31.2)
MCHC: 31.2 g/dL — AB (ref 31.8–35.4)
MCV: 88.9 fL (ref 80–97)
MPV: 7.6 fL (ref 0–99.8)
POC Granulocyte: 14.6 — AB (ref 2–6.9)
POC LYMPH PERCENT: 11.6 %L (ref 10–50)
Platelet Count, POC: 506 10*3/uL — AB (ref 142–424)
RBC: 5.1 M/uL (ref 4.04–5.48)
RDW, POC: 14.3 %
WBC: 17.3 10*3/uL — AB (ref 4.6–10.2)

## 2014-04-13 NOTE — Progress Notes (Signed)
   Subjective:    Patient ID: Gwendolyn Howard, female    DOB: 12-Jan-1966, 49 y.o.   MRN: [id number]  HPI Patient is here for cough and URI sx's.  She is still having a cough and she is still wheezing.   Review of Systems No chest pain, SOB, HA, dizziness, vision change, N/V, diarrhea, constipation, dysuria, urinary urgency or frequency, myalgias, arthralgias or rash.     Objective:   Physical Exam  Vital signs noted  Well developed well nourished efmale.  HEENT - Head atraumatic Normocephalic                Eyes - PERRLA, Conjuctiva - clear Sclera- Clear EOMI                Ears - EAC's Wnl TM's Wnl Gross Hearing WNL                Nose - Nares patent                 Throat - oropharanx wnl Respiratory - Lungs CTA bilateral Cardiac - RRR S1 and S2 without murmur GI - Abdomen soft Nontender and bowel sounds active x 4 Extremities - No edema. Neuro - Grossly intact.  cxr - No infiltrates Prelimnary reading by Iverson Alamin  Results for orders placed or performed in visit on 04/13/14  POCT CBC  Result Value Ref Range   WBC 17.3 (A) 4.6 - 10.2 K/uL   Lymph, poc 2.0 0.6 - 3.4   POC LYMPH PERCENT 11.6 10 - 50 %L   POC Granulocyte 14.6 (A) 2 - 6.9   Granulocyte percent 84.4 (A) 37 - 80 %G   RBC 5.1 4.04 - 5.48 M/uL   Hemoglobin 14.1 12.2 - 16.2 g/dL   HCT, POC 45.2 37.7 - 47.9 %   MCV 88.9 80 - 97 fL   MCH, POC 27.8 27 - 31.2 pg   MCHC 31.2 (A) 31.8 - 35.4 g/dL   RDW, POC 14.3 %   Platelet Count, POC 506.0 (A) 142 - 424 K/uL   MPV 7.6 0 - 99.8 fL      Assessment & Plan:     ICD-9-CM ICD-10-CM   1. Acute bronchitis due to other specified organisms 466.0 J20.8 POCT CBC     BMP8+EGFR     DG Chest 2 View     Sedimentation rate     Ambulatory referral to Allergy     CANCELED: POCT SEDIMENTATION RATE  Wbc' s elevated due to prednisone.  Continue levaquin, prednisone taper, hycodan cough syrup.  Increase Nebs to tid to qid prn  Referral to allergy  Gwendolyn Penner FNP

## 2014-04-13 NOTE — Progress Notes (Signed)
   Subjective:    Patient ID: Gwendolyn Howard, female    DOB: 04-18-1965, 49 y.o.   MRN: [id number]  HPI  Patient is here with c/o cough and URI sx's.  She was seen   Review of Systems    No chest pain, SOB, HA, dizziness, vision change, N/V, diarrhea, constipation, dysuria, urinary urgency or frequency, myalgias, arthralgias or rash.   Objective:   Physical Exam Vital signs noted  Well developed well nourished female.  HEENT - Head atraumatic Normocephalic                Eyes - PERRLA, Conjuctiva - clear Sclera- Clear EOMI                Ears - EAC's Wnl TM's Wnl Gross Hearing WNL                Nose - Nares patent                 Throat - oropharanx wnl Respiratory - Lungs CTA bilateral Cardiac - RRR S1 and S2 without murmur GI - Abdomen soft Nontender and bowel sounds active x 4 Extremities - No edema. Neuro - Grossly intact.      Assessment & Plan:

## 2014-04-14 LAB — BMP8+EGFR
BUN/Creatinine Ratio: 20 (ref 9–23)
BUN: 15 mg/dL (ref 6–24)
CO2: 25 mmol/L (ref 18–29)
Calcium: 9.5 mg/dL (ref 8.7–10.2)
Chloride: 95 mmol/L — ABNORMAL LOW (ref 97–108)
Creatinine, Ser: 0.75 mg/dL (ref 0.57–1.00)
GFR calc Af Amer: 108 mL/min/{1.73_m2} (ref >59)
GFR calc non Af Amer: 94 mL/min/{1.73_m2} (ref >59)
Glucose: 212 mg/dL — ABNORMAL HIGH (ref 65–99)
Potassium: 4.7 mmol/L (ref 3.5–5.2)
Sodium: 134 mmol/L (ref 134–144)

## 2014-04-14 LAB — SEDIMENTATION RATE: Sed Rate: 14 mm/hr (ref 0–32)

## 2014-04-24 ENCOUNTER — Other Ambulatory Visit: Payer: Self-pay | Admitting: *Deleted

## 2014-04-24 ENCOUNTER — Other Ambulatory Visit: Payer: Self-pay | Admitting: Obstetrics and Gynecology

## 2014-04-24 MED ORDER — MONTELUKAST SODIUM 10 MG PO TABS: 10.0000 mg | ORAL_TABLET | Freq: Every day | ORAL | Status: AC

## 2014-05-12 ENCOUNTER — Other Ambulatory Visit: Payer: Self-pay | Admitting: Family Medicine

## 2014-05-15 ENCOUNTER — Other Ambulatory Visit: Payer: Self-pay | Admitting: Family Medicine

## 2014-05-16 ENCOUNTER — Other Ambulatory Visit: Payer: Self-pay | Admitting: *Deleted

## 2014-05-16 MED ORDER — DILTIAZEM HCL 30 MG PO TABS: 30.0000 mg | ORAL_TABLET | Freq: Four times a day (QID) | ORAL | Status: AC

## 2014-05-16 MED ORDER — PANTOPRAZOLE SODIUM 40 MG PO TBEC: 40.0000 mg | DELAYED_RELEASE_TABLET | Freq: Every day | ORAL | Status: DC

## 2014-05-16 NOTE — Telephone Encounter (Signed)
Patient is requesting a 90 day rx for diltiazem and protonix with a years worth of refills sent to pharmacy. Last saw [facility]. Please advise

## 2014-05-22 ENCOUNTER — Other Ambulatory Visit: Payer: Self-pay | Admitting: *Deleted

## 2014-05-22 MED ORDER — LISINOPRIL-HYDROCHLOROTHIAZIDE 20-12.5 MG PO TABS
1.0000 | ORAL_TABLET | Freq: Every day | ORAL | Status: DC
Start: 2014-05-22 — End: 2018-10-22

## 2014-05-28 ENCOUNTER — Other Ambulatory Visit: Payer: Self-pay

## 2014-05-28 DIAGNOSIS — M62838 Other muscle spasm: Secondary | ICD-10-CM

## 2014-05-28 MED ORDER — CYCLOBENZAPRINE HCL 10 MG PO TABS
10.0000 mg | ORAL_TABLET | Freq: Three times a day (TID) | ORAL | Status: DC | PRN
Start: 2014-05-28 — End: 2018-10-22

## 2014-06-08 ENCOUNTER — Other Ambulatory Visit: Payer: Self-pay | Admitting: *Deleted

## 2014-06-08 MED ORDER — METFORMIN HCL 500 MG PO TABS: 500.0000 mg | ORAL_TABLET | Freq: Every day | ORAL | Status: DC

## 2014-06-08 NOTE — Telephone Encounter (Signed)
No A1C on file since 04/2013. She says she gets it checked at [facility] all the time.

## 2014-06-08 NOTE — Telephone Encounter (Signed)
We need copy of labs or will nedd to come in for labs here. no more refills without being seen

## 2014-06-12 ENCOUNTER — Other Ambulatory Visit: Payer: Self-pay | Admitting: *Deleted

## 2014-06-12 MED ORDER — METFORMIN HCL 500 MG PO TABS: 500.0000 mg | ORAL_TABLET | Freq: Every day | ORAL | Status: AC

## 2014-07-11 ENCOUNTER — Telehealth (HOSPITAL_COMMUNITY): Payer: Self-pay | Admitting: *Deleted

## 2014-07-11 ENCOUNTER — Other Ambulatory Visit: Payer: Self-pay | Admitting: *Deleted

## 2014-07-11 MED ORDER — PANTOPRAZOLE SODIUM 40 MG PO TBEC: 40.0000 mg | DELAYED_RELEASE_TABLET | Freq: Two times a day (BID) | ORAL | Status: DC

## 2014-07-11 NOTE — Telephone Encounter (Signed)
Requesting written prescription for protonix. She takes 40mg  BID.

## 2014-07-19 ENCOUNTER — Other Ambulatory Visit (HOSPITAL_COMMUNITY): Payer: Self-pay | Admitting: Orthopedic Surgery

## 2014-07-19 DIAGNOSIS — M25569 Pain in unspecified knee: Secondary | ICD-10-CM

## 2014-07-24 ENCOUNTER — Encounter (HOSPITAL_COMMUNITY): Payer: PRIVATE HEALTH INSURANCE

## 2014-07-24 ENCOUNTER — Other Ambulatory Visit (HOSPITAL_COMMUNITY): Payer: Self-pay | Admitting: Orthopedic Surgery

## 2014-07-24 DIAGNOSIS — M79605 Pain in left leg: Principal | ICD-10-CM

## 2014-07-24 DIAGNOSIS — M79604 Pain in right leg: Secondary | ICD-10-CM

## 2014-07-31 ENCOUNTER — Encounter (HOSPITAL_COMMUNITY): Payer: PRIVATE HEALTH INSURANCE

## 2014-08-22 NOTE — Telephone Encounter (Signed)
x

## 2014-08-27 ENCOUNTER — Other Ambulatory Visit: Payer: Self-pay

## 2015-04-06 ENCOUNTER — Emergency Department (HOSPITAL_COMMUNITY): Payer: PRIVATE HEALTH INSURANCE

## 2015-04-06 ENCOUNTER — Emergency Department (HOSPITAL_COMMUNITY)
Admission: EM | Admit: 2015-04-06 | Discharge: 2015-04-07 | Disposition: A | Discharge status: Home / Self Care | Payer: PRIVATE HEALTH INSURANCE | Attending: Emergency Medicine | Admitting: Emergency Medicine

## 2015-04-06 DIAGNOSIS — W1839XA Other fall on same level, initial encounter: Secondary | ICD-10-CM | POA: Diagnosis not present

## 2015-04-06 DIAGNOSIS — Z8659 Personal history of other mental and behavioral disorders: Secondary | ICD-10-CM | POA: Diagnosis not present

## 2015-04-06 DIAGNOSIS — K219 Gastro-esophageal reflux disease without esophagitis: Secondary | ICD-10-CM | POA: Insufficient documentation

## 2015-04-06 DIAGNOSIS — Z862 Personal history of diseases of the blood and blood-forming organs and certain disorders involving the immune mechanism: Secondary | ICD-10-CM | POA: Diagnosis not present

## 2015-04-06 DIAGNOSIS — Y998 Other external cause status: Secondary | ICD-10-CM | POA: Diagnosis not present

## 2015-04-06 DIAGNOSIS — Z8639 Personal history of other endocrine, nutritional and metabolic disease: Secondary | ICD-10-CM | POA: Insufficient documentation

## 2015-04-06 DIAGNOSIS — Y9389 Activity, other specified: Secondary | ICD-10-CM | POA: Diagnosis not present

## 2015-04-06 DIAGNOSIS — Z8739 Personal history of other diseases of the musculoskeletal system and connective tissue: Secondary | ICD-10-CM | POA: Insufficient documentation

## 2015-04-06 DIAGNOSIS — Z7984 Long term (current) use of oral hypoglycemic drugs: Secondary | ICD-10-CM | POA: Diagnosis not present

## 2015-04-06 DIAGNOSIS — Y9289 Other specified places as the place of occurrence of the external cause: Secondary | ICD-10-CM | POA: Insufficient documentation

## 2015-04-06 DIAGNOSIS — Z7951 Long term (current) use of inhaled steroids: Secondary | ICD-10-CM | POA: Insufficient documentation

## 2015-04-06 DIAGNOSIS — S8991XA Unspecified injury of right lower leg, initial encounter: Secondary | ICD-10-CM | POA: Diagnosis not present

## 2015-04-06 DIAGNOSIS — Z79899 Other long term (current) drug therapy: Secondary | ICD-10-CM | POA: Diagnosis not present

## 2015-04-06 NOTE — ED Notes (Addendum)
Pt. Came in with complaint of right leg pain at 10/10. Pt. Stated that she was up on a foot stool cleaning and when she got down she heard her right knee popped , she went off balance and almost fall.  Pt. Stated that she had twice left knee surgeries years ago. Not on blood thinner.  Pain felt at the back of the right knee radiating to medial and lateral lower leg.

## 2015-04-07 MED ORDER — OXYCODONE-ACETAMINOPHEN 5-325 MG PO TABS
1.0000 | ORAL_TABLET | Freq: Four times a day (QID) | ORAL | Status: DC | PRN
Start: 2015-04-07 — End: 2017-01-23

## 2015-04-07 MED ORDER — KETOROLAC TROMETHAMINE 60 MG/2ML IM SOLN
60.0000 mg | Freq: Once | INTRAMUSCULAR | Status: AC
  Administered 2015-04-07: 60 mg via INTRAMUSCULAR
  Filled 2015-04-07: qty 2

## 2015-04-07 NOTE — ED Notes (Signed)
Pt is very tearful,  She states she was getting off ladder and felt a pop and immediate pain,

## 2015-04-07 NOTE — ED Provider Notes (Signed)
CSN: [id number]     Arrival date & time 04/06/15  2230 History   First MD Initiated Contact with Patient 04/06/15 2353     Chief Complaint  Patient presents with  . Leg Pain     (Consider location/radiation/quality/duration/timing/severity/associated sxs/prior Treatment) HPI   Patient to the ER for evaluation of her right knee injury. She was on a chair when she accidentally stepped down wrong and felt a pop and heard a loud noise as well. The pain was excruciating, she fell onto the couch. Denies hitting her head, injuring her neck or loc. She reports having a high pain tolerance. She had hx of surgery to her left knee x 2 for ACL injury and says this feels the same. Denies numbness, tingling, hip pain or back pain.  Denies any other injuries or concerns at this time.  Past Medical History  Diagnosis Date  . Anal stricture   . GERD (gastroesophageal reflux disease)   . Small bowel obstruction   . Hypoglycemia   . Tumor, foot   . Fibromyalgia   . Ulcerative colitis   . Anemia   . Anxiety    Past Surgical History  Procedure Laterality Date  . Total colectomy  12/02/1993  . Proctectomy  12/02/1993  . Knee surgery      lt   Family History  Problem Relation Age of Onset  . Leukemia      1/2 sister  . Heart disease Father   . Diabetes Maternal Grandmother   . Diabetes Paternal Grandmother   . Diabetes Maternal Uncle   . Hypothyroidism Mother   . Irritable bowel syndrome Sister   . Colon cancer Neg Hx   . Diverticulitis Paternal Grandfather    Social History  Substance Use Topics  . Smoking status: Never Smoker   . Smokeless tobacco: Never Used  . Alcohol Use: Yes     Comment: every 3 to 4 months   OB History    No data available     Review of Systems  Review of Systems All other systems negative except as documented in the HPI. All pertinent positives and negatives as reviewed in the HPI.   Allergies  Erythromycin and Mesalamine  Home Medications   Prior  to Admission medications   Medication Sig Start Date End Date Taking? Authorizing Provider  albuterol (PROVENTIL HFA;VENTOLIN HFA) 108 (90 BASE) MCG/ACT inhaler Inhale 2 puffs into the lungs every 6 (six) hours as needed for wheezing or shortness of breath. 09/05/13  Yes Lysbeth Penner, FNP  beclomethasone (QVAR) 80 MCG/ACT inhaler Inhale 1 puff into the lungs 2 (two) times daily. 06/23/13  Yes Vernie Shanks, MD  cyanocobalamin (,VITAMIN B-12,) 1000 MCG/ML injection Inject 1 mL (1,000 mcg total) into the muscle every 30 (thirty) days. 02/01/14  Yes Lysbeth Penner, FNP  cyclobenzaprine (FLEXERIL) 10 MG tablet Take 1 tablet (10 mg total) by mouth 3 (three) times daily as needed. 05/28/14  Yes Chipper Herb, MD  desloratadine (CLARINEX) 5 MG tablet Take 5 mg by mouth daily.   Yes Historical Provider, MD  diltiazem (CARDIZEM) 30 MG tablet Take 1 tablet (30 mg total) by mouth 4 (four) times daily. 05/16/14  Yes Chipper Herb, MD  diphenhydrAMINE (BENADRYL) 25 mg capsule Take 25 mg by mouth every 6 (six) hours as needed for allergies.   Yes Historical Provider, MD  fluticasone (FLONASE) 50 MCG/ACT nasal spray Place 1 spray into both nostrils daily.   Yes Historical Provider, MD  lisinopril-hydrochlorothiazide (PRINZIDE,ZESTORETIC) 20-12.5 MG per tablet Take 1 tablet by mouth daily. 05/22/14  Yes Sharion Balloon, FNP  metFORMIN (GLUCOPHAGE) 500 MG tablet Take 1 tablet (500 mg total) by mouth at bedtime. Patient taking differently: Take 500 mg by mouth daily with breakfast.  06/12/14  Yes Mary-Margaret Hassell Done, FNP  montelukast (SINGULAIR) 10 MG tablet Take 1 tablet (10 mg total) by mouth at bedtime. 04/24/14  Yes Chipper Herb, MD  ondansetron (ZOFRAN) 4 MG tablet Take 1 tablet (4 mg total) by mouth every 6 (six) hours. 11/02/13  Yes Larene Pickett, PA-C  pantoprazole (PROTONIX) 40 MG tablet Take 1 tablet (40 mg total) by mouth 2 (two) times daily. 07/11/14  Yes Wardell Honour, MD  HYDROcodone-homatropine  [facility]) 5-1.5 MG/5ML syrup Take 5 mLs by mouth every 8 (eight) hours as needed for cough. Patient not taking: Reported on 04/06/2015 04/09/14   Lysbeth Penner, FNP  levalbuterol Penne Lash) 1.25 MG/3ML nebulizer solution Take 1.25 mg by nebulization every 4 (four) hours as needed for wheezing. Patient not taking: Reported on 04/06/2015 04/09/14   Lysbeth Penner, FNP  levofloxacin (LEVAQUIN) 500 MG tablet Take 1 tablet (500 mg total) by mouth daily. Patient not taking: Reported on 04/06/2015 04/09/14   Lysbeth Penner, FNP  meclizine (ANTIVERT) 25 MG tablet Take 1 tablet (25 mg total) by mouth 3 (three) times daily as needed for dizziness. Patient not taking: Reported on 04/06/2015 11/02/13   Larene Pickett, PA-C  oxyCODONE-acetaminophen (PERCOCET/ROXICET) 5-325 MG tablet Take 1-2 tablets by mouth every 6 (six) hours as needed for severe pain. 04/07/15   Reighan Hipolito Carlota Raspberry, PA-C  predniSONE (DELTASONE) 10 MG tablet Take 4 po qd x 3 days then 3 po qd x 3 days then 2 po qd x 3 days then 1 po qd x 3 days then stop Patient not taking: Reported on 04/06/2015 04/09/14   Lysbeth Penner, FNP   BP 118/82 mmHg  Pulse 99  Temp(Src) 98.2 F (36.8 C) (Oral)  Resp 16  SpO2 98%  LMP 04/05/2015 Physical Exam  Constitutional: She appears well-developed and well-nourished. No distress.  HENT:  Head: Normocephalic and atraumatic.  Eyes: Pupils are equal, round, and reactive to light.  Neck: Normal range of motion. Neck supple.  Cardiovascular: Normal rate and regular rhythm.   Pulmonary/Chest: Effort normal.  Abdominal: Soft.  Musculoskeletal:       Right knee: She exhibits decreased range of motion (patient is able to flex and extend knee, but it is decreased). She exhibits no swelling, no effusion, no deformity, no laceration, no erythema, normal alignment and no LCL laxity. Tenderness (and posterior knee) found. Medial joint line tenderness noted.  Sensations and cap refill intact.  Neurological: She is alert.  Skin:  Skin is warm and dry.  Nursing note and vitals reviewed.   ED Course  Procedures (including critical care time) Labs Review Labs Reviewed - No data to display  Imaging Review Dg Knee Complete 4 Views Right  04/06/2015  CLINICAL DATA:  Right knee pain after injury. Felt a pop after stepping down from a stool. History of right knee surgery. EXAM: RIGHT KNEE - COMPLETE 4+ VIEW COMPARISON:  None. FINDINGS: There is no evidence of fracture, dislocation, or joint effusion. Tiny peripheral osteophytes. Fragmentation of the tibial tubercle is chronic and of no significance. Soft tissues are unremarkable. IMPRESSION: No acute fracture or dislocation.  Trace degenerative change. Electronically Signed   By: Jeb Levering M.D.   On:  04/06/2015 23:41   I have personally reviewed and evaluated these images and lab results as part of my medical decision-making.   EKG Interpretation None      MDM   Final diagnoses:  Knee injury, right, initial encounter    Medications  ketorolac (TORADOL) injection 60 mg (not administered)   Patient X-Ray negative for obvious fracture or dislocation of right knee, concern for tendon/ligament injury.  Pt advised to follow up with orthopedics. Patient given knee immobilizer and advised to stay in brace until cleared by Ortho, while in ED, conservative therapy recommended and discussed. Patient will be discharged home & is agreeable with above plan. Returns precautions discussed. Pt appears safe for discharge.     Delos Haring, PA-C 04/07/15 [id number]  April Palumbo, MD 04/07/15 [phone]

## 2015-04-07 NOTE — Discharge Instructions (Signed)
Knee Immobilizer A knee immobilizer is used to support and protect an injured or painful knee. Knee immobilizers keep your knee from being used while it is healing. Some of the common immobilizers used include splints (air, plaster, fiberglass, stiff cloth, or aluminum) or casts. Wear your knee immobilizer as instructed and only remove it as instructed. HOME CARE INSTRUCTIONS   Use absorbent powder (such as baby powder or talcum powder) to control irritation from sweat and friction.  Adjust the immobilizer to be firm but not tight. Signs of an immobilizer that is too tight include:  Swelling.  Numbness.  Color change in your foot or ankle.  Increased pain.  While resting, raise your leg above the level of your heart. Pillows can be used for support. This reduces throbbing and helps healing.  Remove the immobilizer to bathe and sleep. SEEK MEDICAL CARE IF:   You have increasing pain or swelling in the knee, foot, or ankle.  You have problems caused by the knee immobilizer, or it breaks or needs replacement. MAKE SURE YOU:   Understand these instructions.  Will watch your condition.  Will get help right away if you are not doing well or get worse.   This information is not intended to replace advice given to you by your health care provider. Make sure you discuss any questions you have with your health care provider.   Document Released: 02/16/2005 Document Revised: 03/09/2014 Document Reviewed: 10/10/2012 Elsevier Interactive Patient Education [employer].

## 2017-01-22 ENCOUNTER — Encounter (HOSPITAL_COMMUNITY): Payer: Self-pay

## 2017-01-22 DIAGNOSIS — J45909 Unspecified asthma, uncomplicated: Secondary | ICD-10-CM | POA: Diagnosis not present

## 2017-01-22 DIAGNOSIS — R1030 Lower abdominal pain, unspecified: Secondary | ICD-10-CM | POA: Insufficient documentation

## 2017-01-22 DIAGNOSIS — E119 Type 2 diabetes mellitus without complications: Secondary | ICD-10-CM | POA: Insufficient documentation

## 2017-01-22 DIAGNOSIS — Z79899 Other long term (current) drug therapy: Secondary | ICD-10-CM | POA: Insufficient documentation

## 2017-01-22 NOTE — ED Triage Notes (Signed)
Pt presents with c/o abdominal pain. Pt reports that she has a J pouch, hx of total colectomy. Pt reports that yesterday she had a bunch of coconut and has not used the bathroom at all today. Pt reports she has tried to dilate her pouch with no success.

## 2017-01-23 ENCOUNTER — Emergency Department (HOSPITAL_COMMUNITY)
Admission: EM | Admit: 2017-01-23 | Discharge: 2017-01-23 | Disposition: A | Discharge status: Home / Self Care | Payer: PRIVATE HEALTH INSURANCE | Attending: Emergency Medicine | Admitting: Emergency Medicine

## 2017-01-23 DIAGNOSIS — R1030 Lower abdominal pain, unspecified: Secondary | ICD-10-CM

## 2017-01-23 MED ORDER — KETOROLAC TROMETHAMINE 10 MG PO TABS: 10.0000 mg | ORAL_TABLET | Freq: Four times a day (QID) | ORAL | 0 refills | Status: DC | PRN

## 2017-01-23 NOTE — ED Provider Notes (Signed)
[place] DEPT Provider Note   CSN: [id number] Arrival date & time: 01/22/17  2321     History   Chief Complaint Chief Complaint  Patient presents with  . Abdominal Pain    HPI Gwendolyn Howard is a 51 y.o. female.  Patient here for evaluation for abdominal pain similar to previous bowel obstructions. She has a history of total colectomy with j-pouch and previous blockages secondary to this history. No nausea, vomiting or fever. She tried to manually decompress but was unsuccessful. Last bowel movement yesterday. She states that she has had a bowel movement since arrival to the ED and symptoms have resolved. She has residual diffuse soreness to the abdomen that she feels is from straining for the past one day to make her bowels move. No melena or blood per rectum.    The history is provided by the patient. No language interpreter was used.  Abdominal Pain   Pertinent negatives include fever, nausea and vomiting.    Past Medical History:  Diagnosis Date  . Anal stricture   . Anemia   . Anxiety   . Fibromyalgia   . GERD (gastroesophageal reflux disease)   . Hypoglycemia   . Small bowel obstruction ([place])   . Tumor, foot   . Ulcerative colitis     Patient Active Problem List   Diagnosis Date Noted  . Asthma in adult 04/11/2013  . Anal stricture   . Small bowel obstruction ([place])   . Hypoglycemia   . Fibromyalgia   . Ulcerative colitis   . Anemia   . Anxiety   . Tumor, foot   . Acute bronchitis 09/06/2012  . Yeast vaginitis 09/06/2012  . Asthma with acute exacerbation 09/06/2012  . GERD (gastroesophageal reflux disease) 09/06/2012  . DM (diabetes mellitus) ([place]) 09/06/2012  . Esophageal reflux 05/06/2009  . DYSPHAGIA UNSPECIFIED 05/06/2009    Past Surgical History:  Procedure Laterality Date  . KNEE SURGERY     lt  . PROCTECTOMY  12/02/1993  . TOTAL COLECTOMY  12/02/1993    OB History    No data available       Home  Medications    Prior to Admission medications   Medication Sig Start Date End Date Taking? Authorizing Provider  albuterol (PROVENTIL HFA;VENTOLIN HFA) 108 (90 BASE) MCG/ACT inhaler Inhale 2 puffs into the lungs every 6 (six) hours as needed for wheezing or shortness of breath. 09/05/13  Yes Lysbeth Penner, FNP  beclomethasone (QVAR) 80 MCG/ACT inhaler Inhale 1 puff into the lungs 2 (two) times daily. Patient taking differently: Inhale 1 puff into the lungs 2 (two) times daily as needed (SOB, wheezing).  06/23/13  Yes Vernie Shanks, MD  cyanocobalamin (,VITAMIN B-12,) 1000 MCG/ML injection Inject 1 mL (1,000 mcg total) into the muscle every 30 (thirty) days. 02/01/14  Yes Lysbeth Penner, FNP  cyclobenzaprine (FLEXERIL) 10 MG tablet Take 1 tablet (10 mg total) by mouth 3 (three) times daily as needed. Patient taking differently: Take 10 mg by mouth 3 (three) times daily as needed for muscle spasms.  05/28/14  Yes Chipper Herb, MD  desloratadine (CLARINEX) 5 MG tablet Take 5 mg by mouth daily.   Yes [provider]  diltiazem (CARDIZEM) 30 MG tablet Take 1 tablet (30 mg total) by mouth 4 (four) times daily. 05/16/14  Yes Chipper Herb, MD  hydrochlorothiazide (HYDRODIURIL) 25 MG tablet Take 25 mg by mouth daily after breakfast.   Yes [provider]  montelukast (SINGULAIR) 10 MG tablet Take 1 tablet (10 mg total) by mouth at bedtime. 04/24/14  Yes Chipper Herb, MD  olopatadine (PATANOL) 0.1 % ophthalmic solution Place 1 drop into both eyes 2 (two) times daily.   Yes [provider]  ondansetron (ZOFRAN) 4 MG tablet Take 1 tablet (4 mg total) by mouth every 6 (six) hours. Patient taking differently: Take 4 mg by mouth every 6 (six) hours as needed for nausea or vomiting.  11/02/13  Yes Larene Pickett, PA-C  pantoprazole (PROTONIX) 40 MG tablet Take 1 tablet (40 mg total) by mouth 2 (two) times daily. 07/11/14  Yes Wardell Honour, MD  Polyethyl Glyc-Propyl Glyc PF  (SYSTANE ULTRA PF) 0.4-0.3 % SOLN Place 1 drop into both eyes daily as needed (dry eyes).   Yes [provider]  lisinopril-hydrochlorothiazide (PRINZIDE,ZESTORETIC) 20-12.5 MG per tablet Take 1 tablet by mouth daily. Patient not taking: Reported on 01/23/2017 05/22/14   Sharion Balloon, FNP  meclizine (ANTIVERT) 25 MG tablet Take 1 tablet (25 mg total) by mouth 3 (three) times daily as needed for dizziness. Patient not taking: Reported on 04/06/2015 11/02/13   Larene Pickett, PA-C  metFORMIN (GLUCOPHAGE) 500 MG tablet Take 1 tablet (500 mg total) by mouth at bedtime. Patient not taking: Reported on 01/23/2017 06/12/14   Chevis Pretty, FNP    Family History Family History  Problem Relation Age of Onset  . Leukemia Unknown        1/2 sister  . Heart disease Father   . Diabetes Maternal Grandmother   . Diabetes Paternal Grandmother   . Diabetes Maternal Uncle   . Hypothyroidism Mother   . Irritable bowel syndrome Sister   . Diverticulitis Paternal Grandfather   . Colon cancer Neg Hx     Social History Social History   Tobacco Use  . Smoking status: Never Smoker  . Smokeless tobacco: Never Used  Substance Use Topics  . Alcohol use: Yes    Comment: every 3 to 4 months  . Drug use: No     Allergies   Erythromycin and Mesalamine   Review of Systems Review of Systems  Constitutional: Negative for chills and fever.  HENT: Negative.   Respiratory: Negative.  Negative for shortness of breath.   Cardiovascular: Negative.  Negative for chest pain.  Gastrointestinal: Positive for abdominal pain. Negative for blood in stool, nausea and vomiting.  Genitourinary: Negative.   Musculoskeletal: Negative.  Negative for back pain.  Skin: Negative.   Neurological: Negative.      Physical Exam Updated Vital Signs BP (!) 151/93 (BP Location: Left Arm)   Pulse 98   Temp 97.7 F (36.5 C) (Oral)   Resp 18   Ht 5' 5.5" (1.664 m)   Wt 97.5 kg (215 lb)   SpO2 97%   BMI  35.23 kg/m   Physical Exam  Constitutional: She is oriented to person, place, and time. She appears well-developed and well-nourished.  Neck: Normal range of motion.  Cardiovascular: Normal rate.  Pulmonary/Chest: Effort normal.  Abdominal: Soft. She exhibits no distension and no mass. Bowel sounds are decreased.  Diffuse mild tenderness across lower abdominal fields.   Neurological: She is alert and oriented to person, place, and time.  Skin: Skin is warm and dry.     ED Treatments / Results  Labs (all labs ordered are listed, but only abnormal results are displayed) Labs Reviewed - No data to display  EKG  EKG Interpretation None  Radiology No results found.  Procedures Procedures (including critical care time)  Medications Ordered in ED Medications - No data to display   Initial Impression / Assessment and Plan / ED Course  I have reviewed the triage vital signs and the nursing notes.  Pertinent labs & imaging results that were available during my care of the patient were reviewed by me and considered in my medical decision making (see chart for details).     Patient with a history of total colectomy with j-pouch presents with abdominal pain this afternoon she felt was a likely obstruction secondary to having eaten coconut yesterday. No fever, or vomiting.   Pain has completely resolved since arrival to the ED and after having a bowel movement that she feels contained the suspected coconut.   Exam is essentially benign. She is requesting discharge home without further work up, including imaging. Discussed with Dr. Roxanne Mins who feels the patient has appropriate insight into her condition and is reliable to follow up.     Final Clinical Impressions(s) / ED Diagnoses   Final diagnoses:  None   1. Abdominal pain, resolved  ED Discharge Orders    None       Dennie Bible 33/82/50 [id number]    Delora Fuel, MD 67/34/19 [phone]

## 2017-01-23 NOTE — Discharge Instructions (Signed)
You can be discharged home and should follow up with your doctor for recheck if abdominal soreness does not improve over the next 1-2 days. Return here with any new symptoms of concern.

## 2018-10-21 ENCOUNTER — Emergency Department (HOSPITAL_COMMUNITY)
Admission: EM | Admit: 2018-10-21 | Discharge: 2018-10-22 | Disposition: A | Discharge status: Home / Self Care | Payer: PRIVATE HEALTH INSURANCE | Attending: Emergency Medicine | Admitting: Emergency Medicine

## 2018-10-21 ENCOUNTER — Other Ambulatory Visit: Payer: Self-pay

## 2018-10-21 ENCOUNTER — Encounter (HOSPITAL_COMMUNITY): Payer: Self-pay

## 2018-10-21 DIAGNOSIS — Z79899 Other long term (current) drug therapy: Secondary | ICD-10-CM | POA: Insufficient documentation

## 2018-10-21 DIAGNOSIS — R1084 Generalized abdominal pain: Secondary | ICD-10-CM | POA: Insufficient documentation

## 2018-10-21 LAB — CBG MONITORING, ED: Glucose-Capillary: 156 mg/dL — ABNORMAL HIGH (ref 70–99)

## 2018-10-21 NOTE — ED Notes (Addendum)
Pt has been a Therapist, sports for 30 years. Main reason for ED visit is tachycardia. HR at home was 142, laid down and it dropped to 130. Reports abdominal pain. 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 Anemic, hyperactive bowl sounds, abdominal pain 6/10, headache 7/10, nauseous, hasn't eaten, and feels dehydrated. DM II varies between hyper and hypoglycemia.

## 2018-10-21 NOTE — ED Triage Notes (Signed)
Pt reports tachycardia, chills, and abdominal pain that started when she got off work today. Pt has a hx of UC and has a J-pouch.

## 2018-10-22 ENCOUNTER — Emergency Department (HOSPITAL_COMMUNITY): Payer: PRIVATE HEALTH INSURANCE

## 2018-10-22 LAB — URINALYSIS, ROUTINE W REFLEX MICROSCOPIC
Bilirubin Urine: NEGATIVE
Glucose, UA: NEGATIVE mg/dL
Hgb urine dipstick: NEGATIVE
Ketones, ur: NEGATIVE mg/dL
Leukocytes,Ua: NEGATIVE
Nitrite: NEGATIVE
Protein, ur: NEGATIVE mg/dL
Specific Gravity, Urine: 1.003 — ABNORMAL LOW (ref 1.005–1.030)
pH: 7 (ref 5.0–8.0)

## 2018-10-22 LAB — CBC WITH DIFFERENTIAL/PLATELET
Abs Immature Granulocytes: 0.09 10*3/uL — ABNORMAL HIGH (ref 0.00–0.07)
Basophils Absolute: 0.1 10*3/uL (ref 0.0–0.1)
Basophils Relative: 0 %
Eosinophils Absolute: 0 10*3/uL (ref 0.0–0.5)
Eosinophils Relative: 0 %
HCT: 42.9 % (ref 36.0–46.0)
Hemoglobin: 13.6 g/dL (ref 12.0–15.0)
Immature Granulocytes: 1 %
Lymphocytes Relative: 6 %
Lymphs Abs: 1.2 10*3/uL (ref 0.7–4.0)
MCH: 27.1 pg (ref 26.0–34.0)
MCHC: 31.7 g/dL (ref 30.0–36.0)
MCV: 85.5 fL (ref 80.0–100.0)
Monocytes Absolute: 1.1 10*3/uL — ABNORMAL HIGH (ref 0.1–1.0)
Monocytes Relative: 6 %
Neutro Abs: 16 10*3/uL — ABNORMAL HIGH (ref 1.7–7.7)
Neutrophils Relative %: 87 %
Platelets: 492 10*3/uL — ABNORMAL HIGH (ref 150–400)
RBC: 5.02 MIL/uL (ref 3.87–5.11)
RDW: 14.9 % (ref 11.5–15.5)
WBC: 18.4 10*3/uL — ABNORMAL HIGH (ref 4.0–10.5)
nRBC: 0 % (ref 0.0–0.2)

## 2018-10-22 LAB — COMPREHENSIVE METABOLIC PANEL
ALT: 38 U/L (ref 0–44)
AST: 38 U/L (ref 15–41)
Albumin: 4.2 g/dL (ref 3.5–5.0)
Alkaline Phosphatase: 71 U/L (ref 38–126)
Anion gap: 13 (ref 5–15)
BUN: 19 mg/dL (ref 6–20)
CO2: 24 mmol/L (ref 22–32)
Calcium: 9.8 mg/dL (ref 8.9–10.3)
Chloride: 97 mmol/L — ABNORMAL LOW (ref 98–111)
Creatinine, Ser: 0.88 mg/dL (ref 0.44–1.00)
GFR calc Af Amer: >60 mL/min (ref >60)
GFR calc non Af Amer: >60 mL/min (ref >60)
Glucose, Bld: 134 mg/dL — ABNORMAL HIGH (ref 70–99)
Potassium: 4.4 mmol/L (ref 3.5–5.1)
Sodium: 134 mmol/L — ABNORMAL LOW (ref 135–145)
Total Bilirubin: 1.1 mg/dL (ref 0.3–1.2)
Total Protein: 8.2 g/dL — ABNORMAL HIGH (ref 6.5–8.1)

## 2018-10-22 LAB — LIPASE, BLOOD: Lipase: 24 U/L (ref 11–51)

## 2018-10-22 MED ORDER — IOHEXOL 300 MG/ML  SOLN
100.0000 mL | Freq: Once | INTRAMUSCULAR | Status: AC | PRN
  Administered 2018-10-22: 100 mL via INTRAVENOUS

## 2018-10-22 MED ORDER — KETOROLAC TROMETHAMINE 15 MG/ML IJ SOLN
15.0000 mg | Freq: Once | INTRAMUSCULAR | Status: AC
Start: 2018-10-22 — End: 2018-10-22
  Administered 2018-10-22: 01:00:00 15 mg via INTRAVENOUS
  Filled 2018-10-22: qty 1

## 2018-10-22 MED ORDER — IOHEXOL 300 MG/ML  SOLN
30.0000 mL | Freq: Once | INTRAMUSCULAR | Status: AC | PRN
Start: 2018-10-22 — End: 2018-10-22
  Administered 2018-10-22: 02:00:00 30 mL via ORAL

## 2018-10-22 MED ORDER — ONDANSETRON HCL 4 MG/2ML IJ SOLN
4.0000 mg | Freq: Once | INTRAMUSCULAR | Status: AC
  Administered 2018-10-22: 4 mg via INTRAVENOUS
  Filled 2018-10-22: qty 2

## 2018-10-22 MED ORDER — LORAZEPAM 2 MG/ML IJ SOLN
1.0000 mg | Freq: Once | INTRAMUSCULAR | Status: AC
  Administered 2018-10-22: 1 mg via INTRAVENOUS
  Filled 2018-10-22: qty 1

## 2018-10-22 MED ORDER — ONDANSETRON 8 MG PO TBDP: 8.0000 mg | ORAL_TABLET | Freq: Three times a day (TID) | ORAL | 0 refills | Status: AC | PRN

## 2018-10-22 MED ORDER — SODIUM CHLORIDE (PF) 0.9 % IJ SOLN
INTRAMUSCULAR | Status: AC
  Filled 2018-10-22: qty 50

## 2018-10-22 MED ORDER — SODIUM CHLORIDE 0.9 % IV BOLUS
1000.0000 mL | Freq: Once | INTRAVENOUS | Status: AC
  Administered 2018-10-22: 01:00:00 1000 mL via INTRAVENOUS

## 2018-10-22 MED ORDER — ACETAMINOPHEN 500 MG PO TABS
1000.0000 mg | ORAL_TABLET | Freq: Once | ORAL | Status: DC
  Filled 2018-10-22: qty 2

## 2018-10-22 NOTE — ED Provider Notes (Signed)
[facility] DEPT Provider Note: Georgena Spurling, MD, FACEP  CSN: [id number] MRN: [id number] ARRIVAL: 10/21/18 at 2126 ROOM: [place]  Abdominal Pain   HISTORY OF PRESENT ILLNESS  10/22/18 12:37 AM Gwendolyn Howard is a 53 y.o. female who is status post total proctocolectomy with J-pouch placement.  She has had 2 episodes of increased stool output over the past week.  She has had associated nausea and generalized abdominal pain.  She rates her abdominal pain is an 8 out of 10.  She attributes the abdominal pain to manually dilating her anal sphincter which she does on a semi-regular basis.  She also typically has bleeding after such dilations and the associated bleeding is improving.  She continues to be nauseated and feel lightheaded and thinks she may be dehydrated.  She has also got a headache.   Past Medical History:  Diagnosis Date  . Anal stricture   . Anemia   . Anxiety   . Fibromyalgia   . GERD (gastroesophageal reflux disease)   . Hypoglycemia   . Small bowel obstruction ([place])   . Tumor, foot   . Ulcerative colitis     Past Surgical History:  Procedure Laterality Date  . KNEE SURGERY     lt  . PROCTECTOMY  12/02/1993  . TOTAL COLECTOMY  12/02/1993    Family History  Problem Relation Age of Onset  . Leukemia Unknown        1/2 sister  . Heart disease Father   . Diabetes Maternal Grandmother   . Diabetes Paternal Grandmother   . Diabetes Maternal Uncle   . Hypothyroidism Mother   . Irritable bowel syndrome Sister   . Diverticulitis Paternal Grandfather   . Colon cancer Neg Hx     Social History   Tobacco Use  . Smoking status: Never Smoker  . Smokeless tobacco: Never Used  Substance Use Topics  . Alcohol use: Yes    Comment: every 3 to 4 months  . Drug use: No    Prior to Admission medications   Medication Sig Start Date End Date Taking? Authorizing Provider  albuterol (PROVENTIL HFA;VENTOLIN HFA) 108 (90 BASE) MCG/ACT inhaler  Inhale 2 puffs into the lungs every 6 (six) hours as needed for wheezing or shortness of breath. 09/05/13  Yes Lysbeth Penner, FNP  beclomethasone (QVAR) 80 MCG/ACT inhaler Inhale 1 puff into the lungs 2 (two) times daily. Patient taking differently: Inhale 1 puff into the lungs 2 (two) times daily as needed (SOB, wheezing).  06/23/13  Yes Vernie Shanks, MD  cyanocobalamin (,VITAMIN B-12,) 1000 MCG/ML injection Inject 1 mL (1,000 mcg total) into the muscle every 30 (thirty) days. 02/01/14  Yes Lysbeth Penner, FNP  desloratadine (CLARINEX) 5 MG tablet Take 5 mg by mouth daily.   Yes [provider]  diltiazem (CARDIZEM) 30 MG tablet Take 1 tablet (30 mg total) by mouth 4 (four) times daily. Patient taking differently: Take 30 mg by mouth 4 (four) times daily. Take 1 tablet four times a day for tertiary contractions of the esophagus 05/16/14  Yes Chipper Herb, MD  lisinopril (ZESTRIL) 20 MG tablet Take 20 mg by mouth daily.   Yes [provider]  metFORMIN (GLUCOPHAGE) 500 MG tablet Take 1 tablet (500 mg total) by mouth at bedtime. Patient taking differently: Take 500 mg by mouth 2 (two) times daily with a meal.  06/12/14  Yes Hassell Done, Mary-Margaret, FNP  montelukast (SINGULAIR) 10 MG tablet Take  1 tablet (10 mg total) by mouth at bedtime. 04/24/14  Yes Chipper Herb, MD  Polyethyl Glyc-Propyl Glyc PF (SYSTANE ULTRA PF) 0.4-0.3 % SOLN Place 1 drop into both eyes daily as needed (dry eyes).   Yes [provider]  ondansetron (ZOFRAN ODT) 8 MG disintegrating tablet Take 1 tablet (8 mg total) by mouth every 8 (eight) hours as needed for nausea or vomiting. 10/22/18   Jalia Zuniga, MD    Allergies Erythromycin and Mesalamine   REVIEW OF SYSTEMS  Negative except as noted here or in the History of Present Illness.   PHYSICAL EXAMINATION  Initial Vital Signs Blood pressure 115/75, pulse (!) 101, temperature 98.3 F (36.8 C), temperature source Oral, resp. rate (!) 24,  height 5\' 5"  (1.651 m), weight 97.5 kg, SpO2 91 %.  Examination General: Well-developed, well-nourished female in no acute distress; appearance consistent with age of record HENT: normocephalic; atraumatic Eyes: pupils equal, round and reactive to light; extraocular muscles intact Neck: supple Heart: regular rate and rhythm Lungs: clear to auscultation bilaterally Abdomen: soft; nondistended; mild diffuse tenderness; bowel sounds hypoactive Extremities: No deformity; full range of motion; pulses normal Neurologic: Awake, alert and oriented; motor function intact in all extremities and symmetric; no facial droop Skin: Warm and dry Psychiatric: Normal mood and affect   RESULTS  Summary of this visit's results, reviewed by myself:   EKG Interpretation  Date/Time:    Ventricular Rate:    PR Interval:    QRS Duration:   QT Interval:    QTC Calculation:   R Axis:     Text Interpretation:        Laboratory Studies: Results for orders placed or performed during the hospital encounter of 10/21/18 (from the past 24 hour(s))  CBG monitoring, ED     Status: Abnormal   Collection Time: 10/21/18 10:56 PM  Result Value Ref Range   Glucose-Capillary 156 (H) 70 - 99 mg/dL  CBC with Differential/Platelet     Status: Abnormal   Collection Time: 10/22/18 12:37 AM  Result Value Ref Range   WBC 18.4 (H) 4.0 - 10.5 K/uL   RBC 5.02 3.87 - 5.11 MIL/uL   Hemoglobin 13.6 12.0 - 15.0 g/dL   HCT 42.9 36.0 - 46.0 %   MCV 85.5 80.0 - 100.0 fL   MCH 27.1 26.0 - 34.0 pg   MCHC 31.7 30.0 - 36.0 g/dL   RDW 14.9 11.5 - 15.5 %   Platelets 492 (H) 150 - 400 K/uL   nRBC 0.0 0.0 - 0.2 %   Neutrophils Relative % 87 %   Neutro Abs 16.0 (H) 1.7 - 7.7 K/uL   Lymphocytes Relative 6 %   Lymphs Abs 1.2 0.7 - 4.0 K/uL   Monocytes Relative 6 %   Monocytes Absolute 1.1 (H) 0.1 - 1.0 K/uL   Eosinophils Relative 0 %   Eosinophils Absolute 0.0 0.0 - 0.5 K/uL   Basophils Relative 0 %   Basophils Absolute 0.1  0.0 - 0.1 K/uL   Immature Granulocytes 1 %   Abs Immature Granulocytes 0.09 (H) 0.00 - 0.07 K/uL  Comprehensive metabolic panel     Status: Abnormal   Collection Time: 10/22/18 12:37 AM  Result Value Ref Range   Sodium 134 (L) 135 - 145 mmol/L   Potassium 4.4 3.5 - 5.1 mmol/L   Chloride 97 (L) 98 - 111 mmol/L   CO2 24 22 - 32 mmol/L   Glucose, Bld 134 (H) 70 - 99 mg/dL  BUN 19 6 - 20 mg/dL   Creatinine, Ser 0.88 0.44 - 1.00 mg/dL   Calcium 9.8 8.9 - 10.3 mg/dL   Total Protein 8.2 (H) 6.5 - 8.1 g/dL   Albumin 4.2 3.5 - 5.0 g/dL   AST 38 15 - 41 U/L   ALT 38 0 - 44 U/L   Alkaline Phosphatase 71 38 - 126 U/L   Total Bilirubin 1.1 0.3 - 1.2 mg/dL   GFR calc non Af Amer >60 >60 mL/min   GFR calc Af Amer >60 >60 mL/min   Anion gap 13 5 - 15  Lipase, blood     Status: None   Collection Time: 10/22/18 12:37 AM  Result Value Ref Range   Lipase 24 11 - 51 U/L  Urinalysis, Routine w reflex microscopic     Status: Abnormal   Collection Time: 10/22/18 12:37 AM  Result Value Ref Range   Color, Urine STRAW (A) YELLOW   APPearance CLEAR CLEAR   Specific Gravity, Urine 1.003 (L) 1.005 - 1.030   pH 7.0 5.0 - 8.0   Glucose, UA NEGATIVE NEGATIVE mg/dL   Hgb urine dipstick NEGATIVE NEGATIVE   Bilirubin Urine NEGATIVE NEGATIVE   Ketones, ur NEGATIVE NEGATIVE mg/dL   Protein, ur NEGATIVE NEGATIVE mg/dL   Nitrite NEGATIVE NEGATIVE   Leukocytes,Ua NEGATIVE NEGATIVE   Imaging Studies: Ct Abdomen Pelvis W Contrast  Result Date: 10/22/2018 CLINICAL DATA:  Abdominal pain. History of remote proctocolectomy with J-pouch. EXAM: CT ABDOMEN AND PELVIS WITH CONTRAST TECHNIQUE: Multidetector CT imaging of the abdomen and pelvis was performed using the standard protocol following bolus administration of intravenous contrast. CONTRAST:  155mL OMNIPAQUE IOHEXOL 300 MG/ML  SOLN COMPARISON:  None. FINDINGS: Lower chest: Minimal perifissural thickening of the right minor fissure. No acute airspace disease. No  pleural fluid. Hepatobiliary: No focal liver abnormality is seen. No gallstones, gallbladder wall thickening, or biliary dilatation. Pancreas: Fatty atrophy.  No ductal dilatation or inflammation. Spleen: Normal in size without focal abnormality. Few calcified granuloma. Adrenals/Urinary Tract: No adrenal nodule. No hydronephrosis or perinephric edema. Homogeneous renal enhancement with symmetric excretion on delayed phase imaging. 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 Urinary bladder is near completely empty and not well assessed. Stomach/Bowel: Post proctocolectomy. Stomach physiologically distended. No bowel obstruction or wall thickening. Stool within the distal small bowel and J-pouch. No wall thickening. No perienteric inflammation or edema. Vascular/Lymphatic: Abdominal aorta is normal in caliber. Reactive appearing central mesenteric lymph nodes not enlarged by size criteria. No portal vein is patent. Reproductive: Uterus and bilateral adnexa are unremarkable. Other: Multiple surgical clips throughout the abdomen. No free air. No free fluid. Tiny fat containing umbilical hernia. Musculoskeletal: Degenerative change in the spine with primarily facet hypertrophy. There are no acute or suspicious osseous abnormalities. IMPRESSION: 1. No acute abnormality or explanation for abdominal pain. 2. Post proctocolectomy with stool distending the J-pouch. No obstruction or perienteric inflammation. Electronically Signed   By: Keith Rake M.D.   On: 10/22/2018 03:37    ED COURSE and MDM  Nursing notes and initial vitals signs, including pulse oximetry, reviewed.  Vitals:   10/22/18 0130 10/22/18 0131 10/22/18 0207 10/22/18 0230  BP:  108/69 (!) 163/103   Pulse: 90 87 (!) 132 (!) 103  Resp: (!) 21 (!) 29 (!) 35 18  Temp:      TempSrc:      SpO2: 97% 95% 91% 97%  Weight:      Height:       3:52  AM The patient is feeling better at this time.  She was advised of reassuring CT scan.   The cause for her leukocytosis is unclear but she has been afebrile with normal urine and no signs of infection on CT scan.  She is not currently on steroids.  She was advised to return for worsening symptoms.  She plans to follow-up with her gastroenterologist, Dr. Henrene Pastor.   PROCEDURES    ED DIAGNOSES     ICD-10-CM   1. Generalized abdominal pain  R10.84        Shanon Rosser, MD 10/22/18 [phone]

## 2019-11-06 IMAGING — CT CT ABDOMEN AND PELVIS WITH CONTRAST
2 of 5 series · 16 of 46 positions shown, 18 images · IV contrast (omnipaque)
Comparison: None.

CLINICAL DATA: Abdominal pain. History of remote proctocolectomy
with J-pouch.

EXAM:
CT ABDOMEN AND PELVIS WITH CONTRAST
TECHNIQUE: Multidetector CT imaging of the abdomen and pelvis was performed
using the standard protocol following bolus administration of
intravenous contrast.
CONTRAST:  100mL OMNIPAQUE IOHEXOL 300 MG/ML  SOLN

[Series 2: axial st · axial · 0.88mm/px · z∈[+1069,+1484]mm · 13 of 97 slices shown, 15 images]
[im 7/97  soft-tissue]
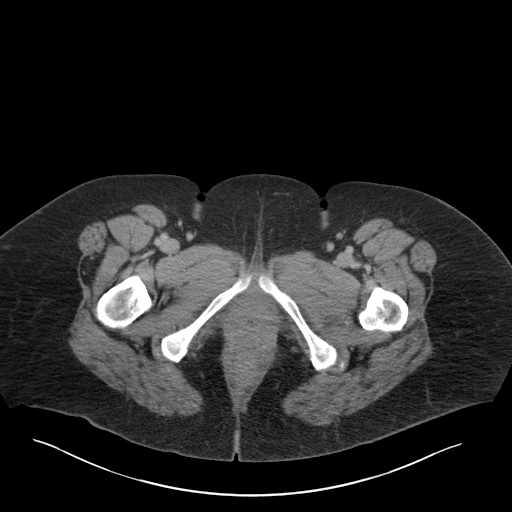
[im 7/97  bone]
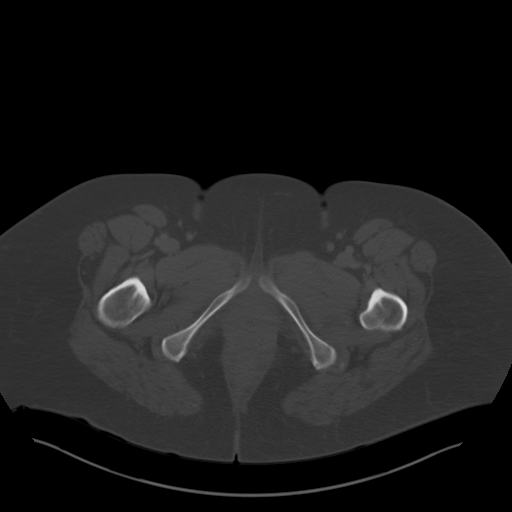
[im 14/97  soft-tissue]
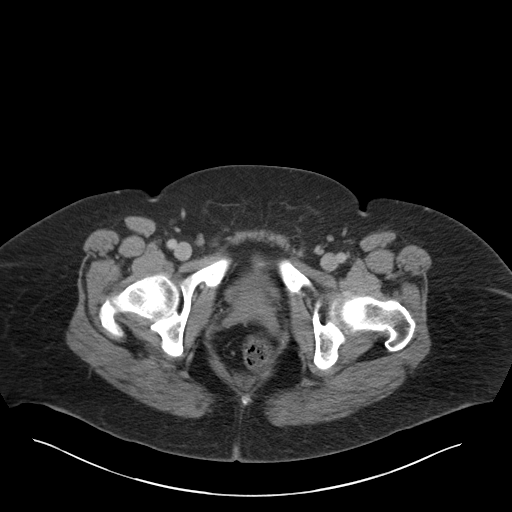
[im 21/97  soft-tissue]
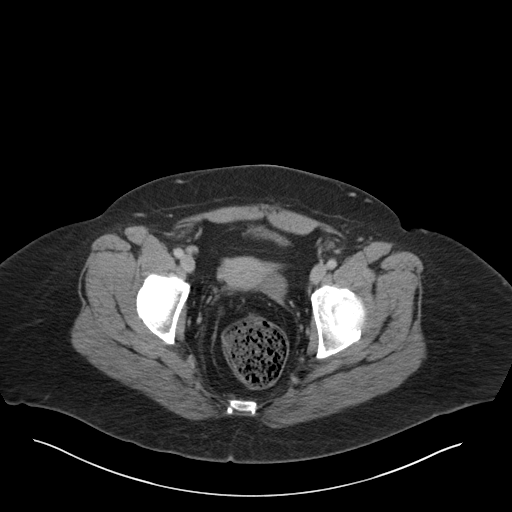
[im 28/97  soft-tissue]
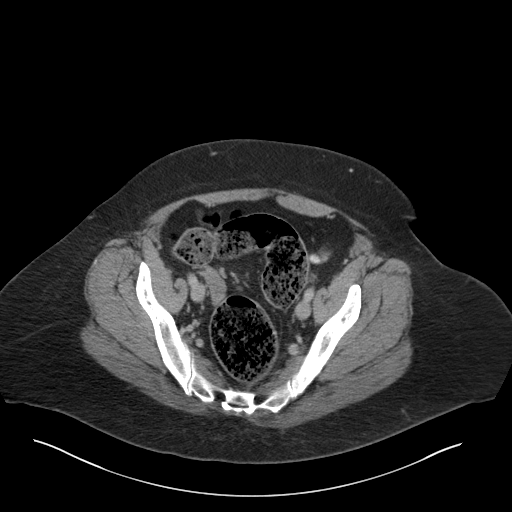
[im 35/97  soft-tissue]
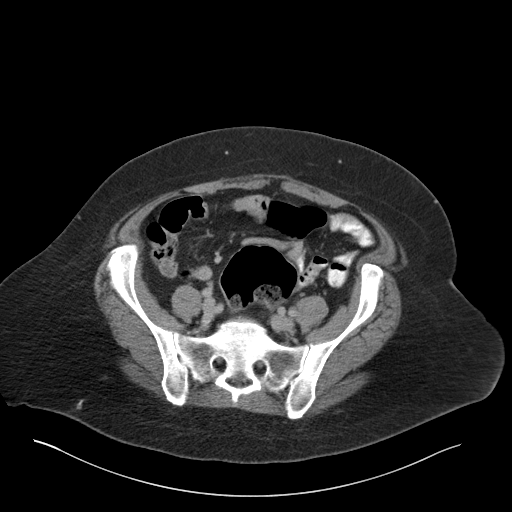
[im 42/97  soft-tissue]
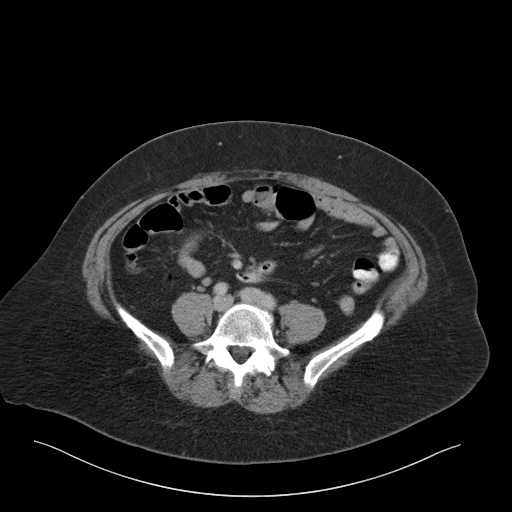
[im 49/97  soft-tissue]
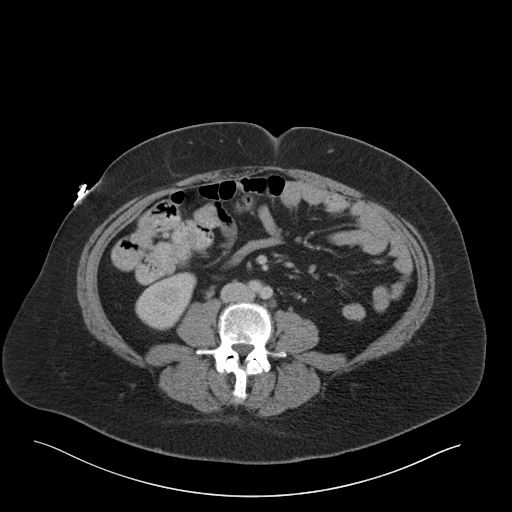
[im 55/97  soft-tissue]
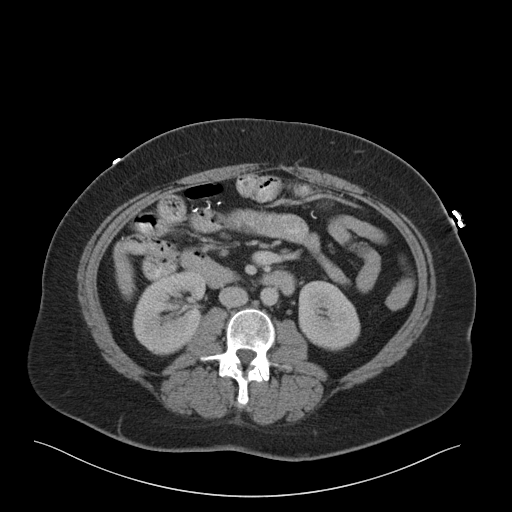
[im 62/97  soft-tissue]
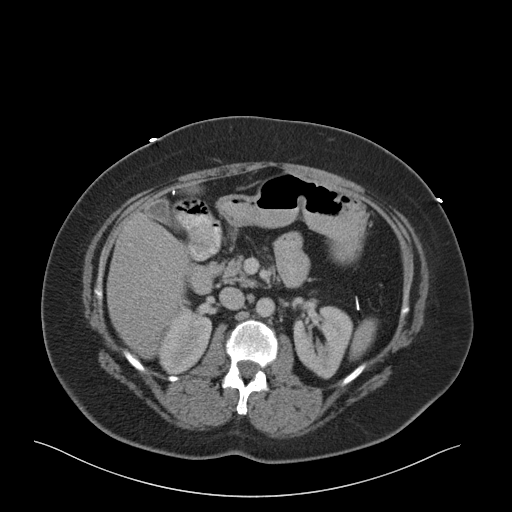
[im 62/97  bone]
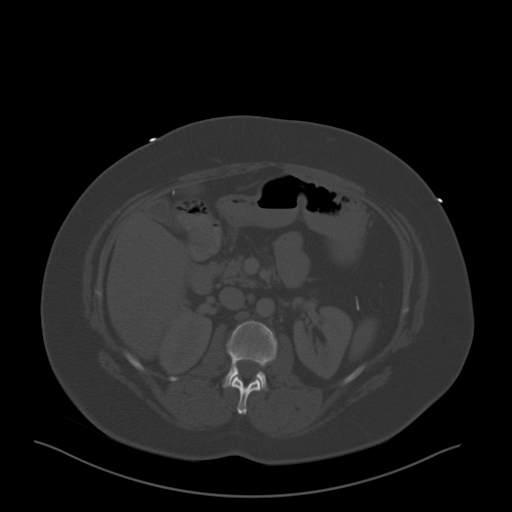
[im 69/97  soft-tissue]
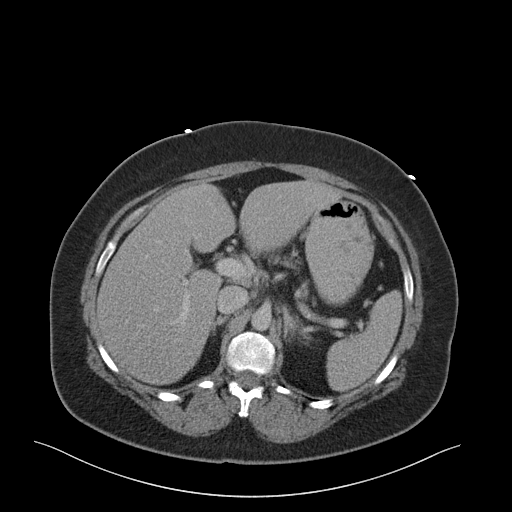
[im 76/97  soft-tissue]
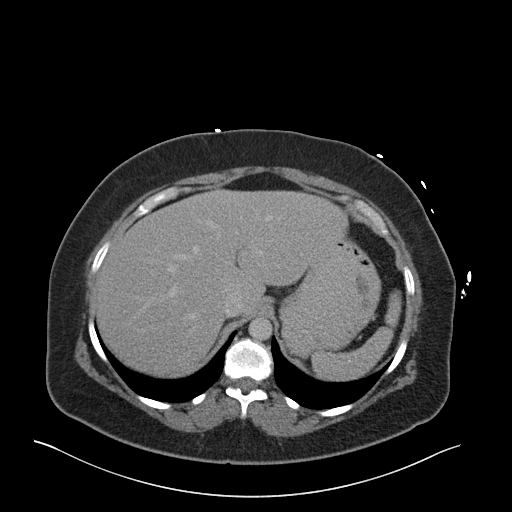
[im 83/97  soft-tissue]
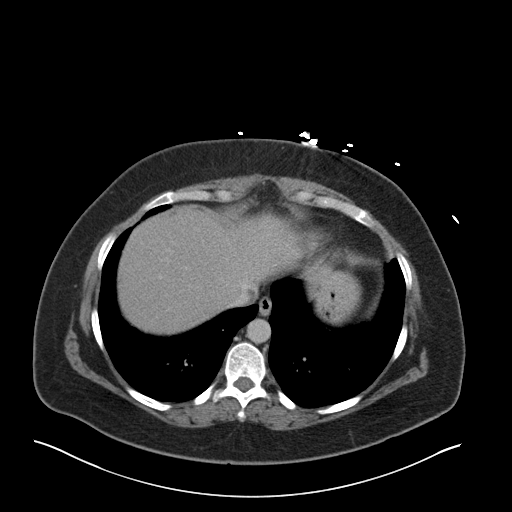
[im 90/97  soft-tissue]
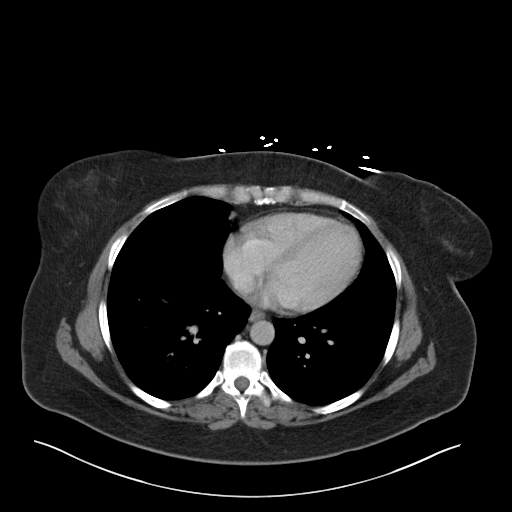

[Series 5: coronal st · coronal · 0.79mm/px · 3 of 157 slices shown]
[im 53/157  soft-tissue]
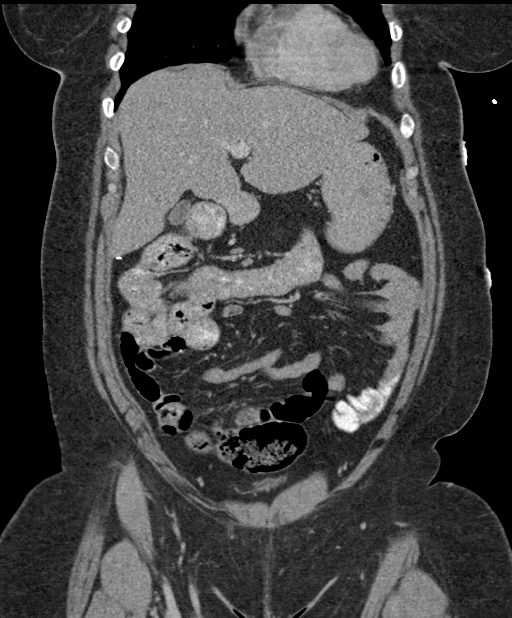
[im 70/157  soft-tissue]
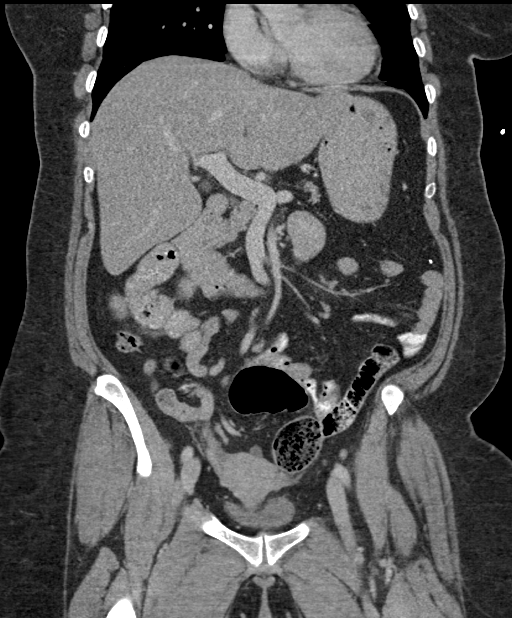
[im 87/157  soft-tissue]
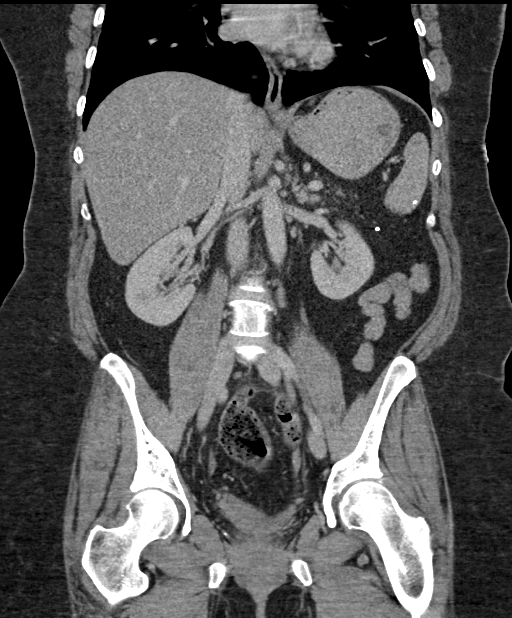

[16 of 46 positions shown; findings below may reference images not displayed]

FINDINGS: Lower chest: Minimal perifissural thickening of the right minor
fissure. No acute airspace disease. No pleural fluid.

Hepatobiliary: No focal liver abnormality is seen. No gallstones,
gallbladder wall thickening, or biliary dilatation.

Pancreas: Fatty atrophy.  No ductal dilatation or inflammation.

Spleen: Normal in size without focal abnormality. Few calcified
granuloma.

Adrenals/Urinary Tract: No adrenal nodule. No hydronephrosis or
perinephric edema. Homogeneous renal enhancement with symmetric
excretion on delayed phase imaging. Right renal collecting system is
partially duplicated. Urinary bladder is near completely empty and
not well assessed.

Stomach/Bowel: Post proctocolectomy. Stomach physiologically
distended. No bowel obstruction or wall thickening. Stool within the
distal small bowel and J-pouch. No wall thickening. No perienteric
inflammation or edema.

Vascular/Lymphatic: Abdominal aorta is normal in caliber. Reactive
appearing central mesenteric lymph nodes not enlarged by size
criteria. No portal vein is patent.

Reproductive: Uterus and bilateral adnexa are unremarkable.

Other: Multiple surgical clips throughout the abdomen. No free air.
No free fluid. Tiny fat containing umbilical hernia.

Musculoskeletal: Degenerative change in the spine with primarily
facet hypertrophy. There are no acute or suspicious osseous
abnormalities.
IMPRESSION: 1. No acute abnormality or explanation for abdominal pain.
2. Post proctocolectomy with stool distending the J-pouch. No
obstruction or perienteric inflammation.

## 2022-07-29 ENCOUNTER — Other Ambulatory Visit: Payer: Self-pay | Admitting: Obstetrics & Gynecology

## 2022-07-29 DIAGNOSIS — R928 Other abnormal and inconclusive findings on diagnostic imaging of breast: Secondary | ICD-10-CM

## 2022-08-25 ENCOUNTER — Other Ambulatory Visit: Payer: PRIVATE HEALTH INSURANCE

## 2022-08-25 ENCOUNTER — Ambulatory Visit
Admission: RE | Admit: 2022-08-25 | Discharge: 2022-08-25 | Disposition: A | Discharge status: Home / Self Care | Payer: PRIVATE HEALTH INSURANCE | Source: Ambulatory Visit | Attending: Obstetrics & Gynecology | Admitting: Obstetrics & Gynecology

## 2022-08-25 ENCOUNTER — Inpatient Hospital Stay: Admission: RE | Admit: 2022-08-25 | Payer: PRIVATE HEALTH INSURANCE | Source: Ambulatory Visit

## 2022-08-25 DIAGNOSIS — R928 Other abnormal and inconclusive findings on diagnostic imaging of breast: Secondary | ICD-10-CM
